# Patient Record
Sex: Female | Born: 1970 | Hispanic: No | Marital: Single | Smoking: Current every day smoker
Health system: Southern US, Community
[De-identification: ages and names within clinical notes are randomized; demographics above are authoritative.]

## PROBLEM LIST (undated history)

## (undated) DIAGNOSIS — Z87440 Personal history of urinary (tract) infections: Secondary | ICD-10-CM

## (undated) DIAGNOSIS — D649 Anemia, unspecified: Secondary | ICD-10-CM

## (undated) DIAGNOSIS — R011 Cardiac murmur, unspecified: Secondary | ICD-10-CM

## (undated) DIAGNOSIS — R55 Syncope and collapse: Secondary | ICD-10-CM

## (undated) DIAGNOSIS — E785 Hyperlipidemia, unspecified: Secondary | ICD-10-CM

## (undated) HISTORY — DX: Personal history of urinary (tract) infections: Z87.440

## (undated) HISTORY — DX: Hyperlipidemia, unspecified: E78.5

## (undated) HISTORY — DX: Syncope and collapse: R55

---

## 2010-10-08 ENCOUNTER — Emergency Department: Payer: Self-pay | Admitting: Emergency Medicine

## 2012-06-23 ENCOUNTER — Emergency Department: Payer: Self-pay | Admitting: Emergency Medicine

## 2012-06-23 LAB — COMPREHENSIVE METABOLIC PANEL WITH GFR
Albumin: 3.8 g/dL (ref 3.4–5.0)
Alkaline Phosphatase: 77 U/L (ref 50–136)
Anion Gap: 8 (ref 7–16)
BUN: 9 mg/dL (ref 7–18)
Bilirubin,Total: 0.3 mg/dL (ref 0.2–1.0)
Calcium, Total: 8.8 mg/dL (ref 8.5–10.1)
Chloride: 110 mmol/L — ABNORMAL HIGH (ref 98–107)
Co2: 23 mmol/L (ref 21–32)
Creatinine: 0.81 mg/dL (ref 0.60–1.30)
EGFR (African American): >60
EGFR (Non-African Amer.): >60
Glucose: 84 mg/dL (ref 65–99)
Osmolality: 279 (ref 275–301)
Potassium: 3.5 mmol/L (ref 3.5–5.1)
SGOT(AST): 11 U/L — ABNORMAL LOW (ref 15–37)
SGPT (ALT): 12 U/L (ref 12–78)
Sodium: 141 mmol/L (ref 136–145)
Total Protein: 7.4 g/dL (ref 6.4–8.2)

## 2012-06-23 LAB — URINALYSIS, COMPLETE
Blood: NEGATIVE
Glucose,UR: NEGATIVE mg/dL (ref 0–75)
RBC,UR: 5 /HPF (ref 0–5)
Specific Gravity: 1.03 (ref 1.003–1.030)
Squamous Epithelial: 4
WBC UR: 3 /HPF (ref 0–5)

## 2012-06-23 LAB — CBC
HGB: 10.4 g/dL — ABNORMAL LOW (ref 12.0–16.0)
MCH: 25.7 pg — ABNORMAL LOW (ref 26.0–34.0)
Platelet: 268 10*3/uL (ref 150–440)
RBC: 4.05 10*6/uL (ref 3.80–5.20)
WBC: 8.3 10*3/uL (ref 3.6–11.0)

## 2012-06-23 LAB — PREGNANCY, URINE: Pregnancy Test, Urine: NEGATIVE m[IU]/mL

## 2012-08-27 ENCOUNTER — Emergency Department: Payer: Self-pay | Admitting: Emergency Medicine

## 2012-08-27 LAB — BASIC METABOLIC PANEL
BUN: 6 mg/dL — ABNORMAL LOW (ref 7–18)
Creatinine: 0.74 mg/dL (ref 0.60–1.30)
EGFR (African American): >60
EGFR (Non-African Amer.): >60
Glucose: 91 mg/dL (ref 65–99)
Osmolality: 277 (ref 275–301)
Potassium: 3.3 mmol/L — ABNORMAL LOW (ref 3.5–5.1)
Sodium: 140 mmol/L (ref 136–145)

## 2012-08-27 LAB — CBC
HCT: 38.9 % (ref 35.0–47.0)
MCHC: 32.8 g/dL (ref 32.0–36.0)
RBC: 4.29 10*6/uL (ref 3.80–5.20)
WBC: 10.7 10*3/uL (ref 3.6–11.0)

## 2012-10-18 ENCOUNTER — Emergency Department: Payer: Self-pay | Admitting: Emergency Medicine

## 2012-10-18 LAB — CBC
HGB: 13.1 g/dL (ref 12.0–16.0)
MCH: 31.9 pg (ref 26.0–34.0)

## 2012-10-18 LAB — COMPREHENSIVE METABOLIC PANEL
Alkaline Phosphatase: 72 U/L (ref 50–136)
Anion Gap: 6 — ABNORMAL LOW (ref 7–16)
Bilirubin,Total: 0.4 mg/dL (ref 0.2–1.0)
Calcium, Total: 8.7 mg/dL (ref 8.5–10.1)
Co2: 23 mmol/L (ref 21–32)
Creatinine: 0.66 mg/dL (ref 0.60–1.30)
EGFR (Non-African Amer.): >60
Sodium: 139 mmol/L (ref 136–145)
Total Protein: 7.1 g/dL (ref 6.4–8.2)

## 2012-10-18 LAB — URINALYSIS, COMPLETE
Glucose,UR: NEGATIVE mg/dL (ref 0–75)
Ketone: NEGATIVE
Nitrite: NEGATIVE
Ph: 6 (ref 4.5–8.0)
RBC,UR: 3 /HPF (ref 0–5)
WBC UR: 23 /HPF (ref 0–5)

## 2012-10-18 LAB — GC/CHLAMYDIA PROBE AMP

## 2012-10-18 LAB — WET PREP, GENITAL

## 2012-10-20 LAB — URINE CULTURE

## 2013-01-28 ENCOUNTER — Emergency Department: Payer: Self-pay | Admitting: Emergency Medicine

## 2013-01-28 LAB — COMPREHENSIVE METABOLIC PANEL
Albumin: 2.9 g/dL — ABNORMAL LOW (ref 3.4–5.0)
Alkaline Phosphatase: 62 U/L (ref 50–136)
Bilirubin,Total: 0.5 mg/dL (ref 0.2–1.0)
Chloride: 111 mmol/L — ABNORMAL HIGH (ref 98–107)
Co2: 22 mmol/L (ref 21–32)
Creatinine: 0.63 mg/dL (ref 0.60–1.30)
EGFR (African American): >60
Glucose: 87 mg/dL (ref 65–99)
Osmolality: 274 (ref 275–301)
Potassium: 3.2 mmol/L — ABNORMAL LOW (ref 3.5–5.1)
SGOT(AST): 11 U/L — ABNORMAL LOW (ref 15–37)
Sodium: 139 mmol/L (ref 136–145)
Total Protein: 5.8 g/dL — ABNORMAL LOW (ref 6.4–8.2)

## 2013-01-28 LAB — CBC
HCT: 34.2 % — ABNORMAL LOW (ref 35.0–47.0)
MCH: 31.8 pg (ref 26.0–34.0)
MCV: 95 fL (ref 80–100)

## 2013-01-28 LAB — TROPONIN I: Troponin-I: <0.02 ng/mL

## 2013-05-26 ENCOUNTER — Emergency Department: Payer: Self-pay | Admitting: Emergency Medicine

## 2013-05-26 LAB — RAPID INFLUENZA A&B ANTIGENS (ARMC ONLY)

## 2013-07-30 ENCOUNTER — Emergency Department: Payer: Self-pay | Admitting: Emergency Medicine

## 2013-07-30 LAB — URINALYSIS, COMPLETE
Bilirubin,UR: NEGATIVE
GLUCOSE, UR: NEGATIVE mg/dL (ref 0–75)
Ketone: NEGATIVE
NITRITE: NEGATIVE
PH: 6 (ref 4.5–8.0)
PROTEIN: NEGATIVE
Specific Gravity: 1.008 (ref 1.003–1.030)
Squamous Epithelial: 2
WBC UR: 4 /HPF (ref 0–5)

## 2013-07-30 LAB — GC/CHLAMYDIA PROBE AMP

## 2013-07-30 LAB — WET PREP, GENITAL

## 2014-09-10 IMAGING — US US PELV - US TRANSVAGINAL
1 series · 14 of 25 positions shown · non-contrast
Comparison: none

REASON FOR EXAM: L adnexal pain, cervical motion tenderness
COMMENTS:

PROCEDURE:     US  - US PELVIS EXAM W/TRANSVAGINAL  - October 18, 2012  [DATE]
RESULT:
TECHNIQUE: Transabdominal and endovaginal imaging of the pelvis was
obtained.

[Series 1: us pelv - us transvaginal · 0.28mm/px · 14 of 79 slices shown]
[im 1/79]
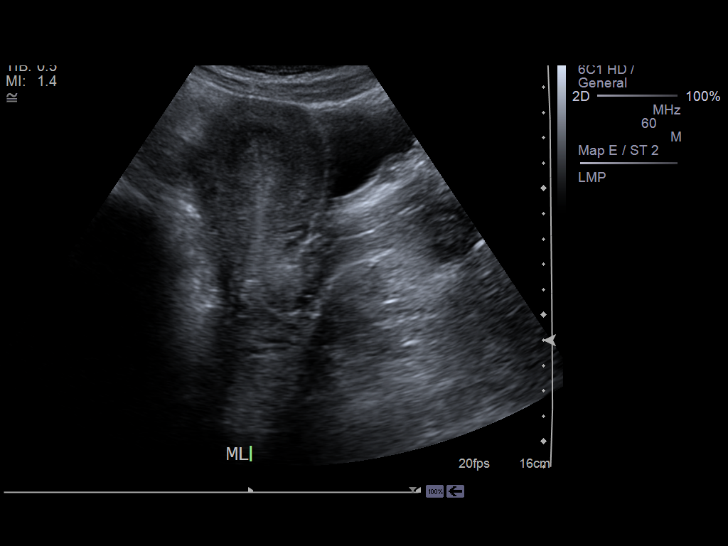
[im 7/79]
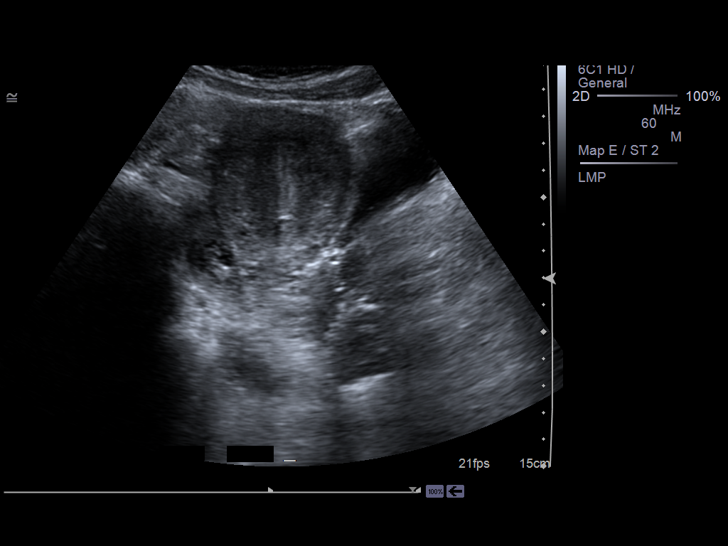
[im 14/79]
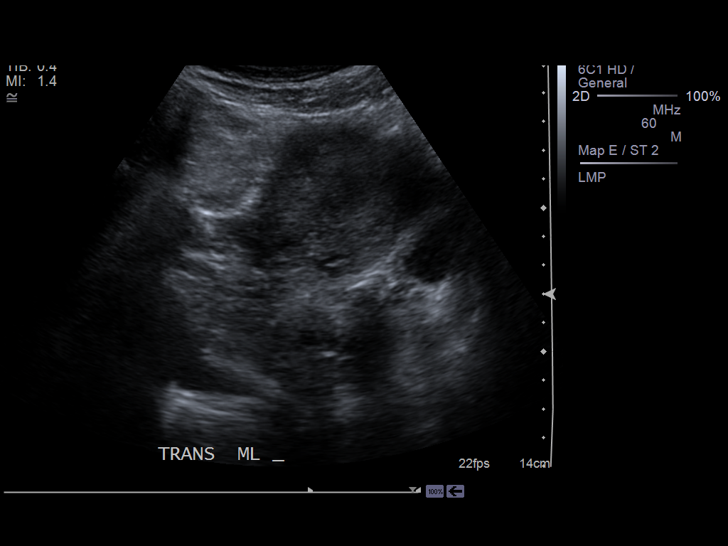
[im 20/79]
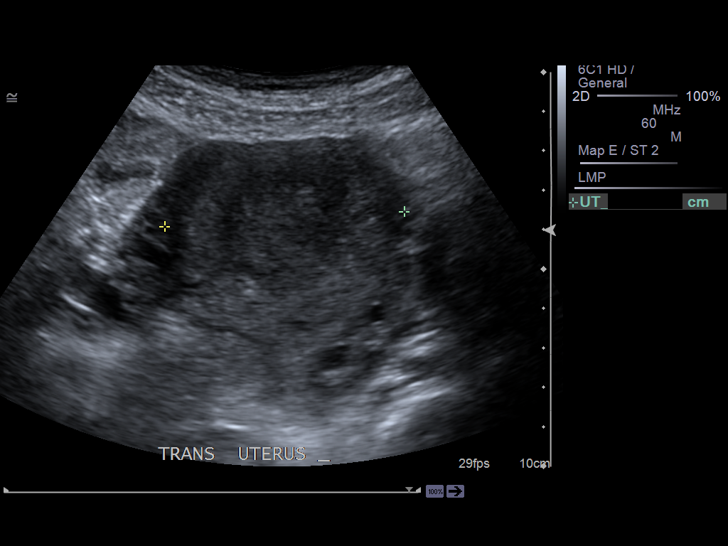
[im 27/79]
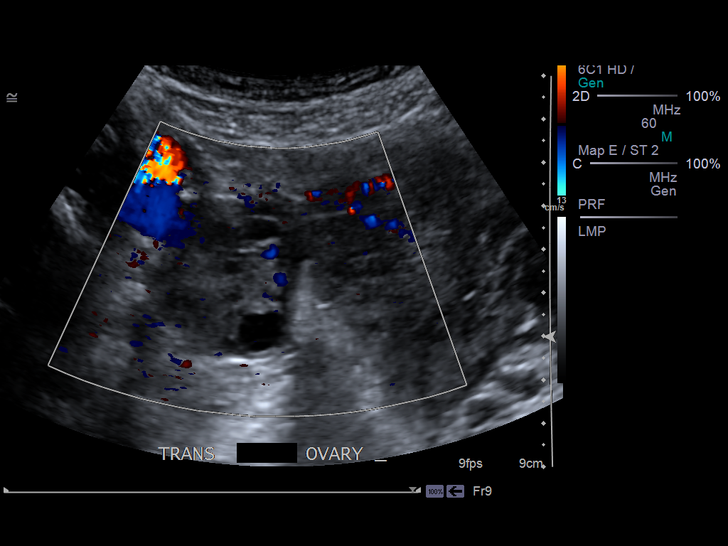
[im 30/79]
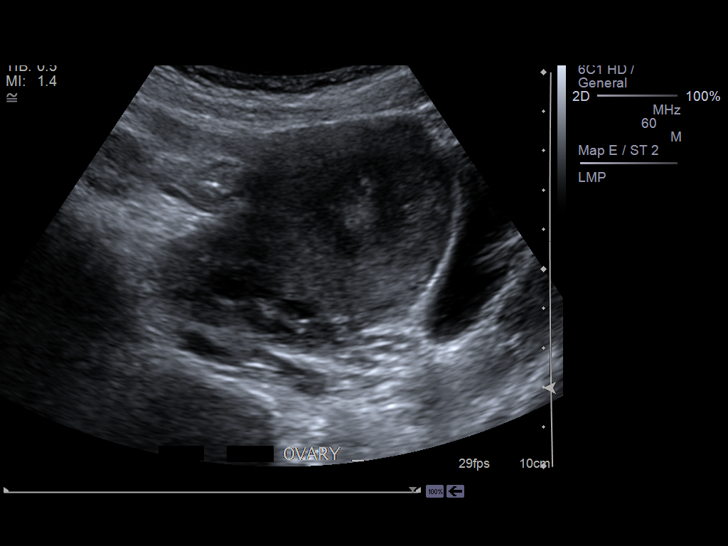
[im 36/79]
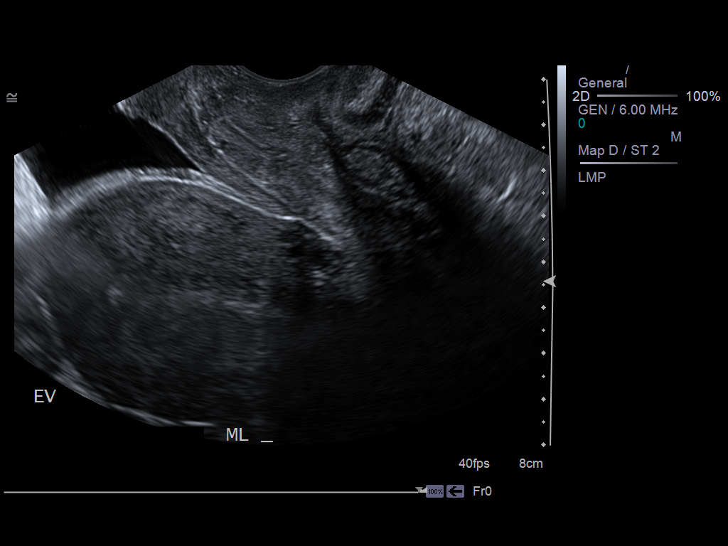
[im 43/79]
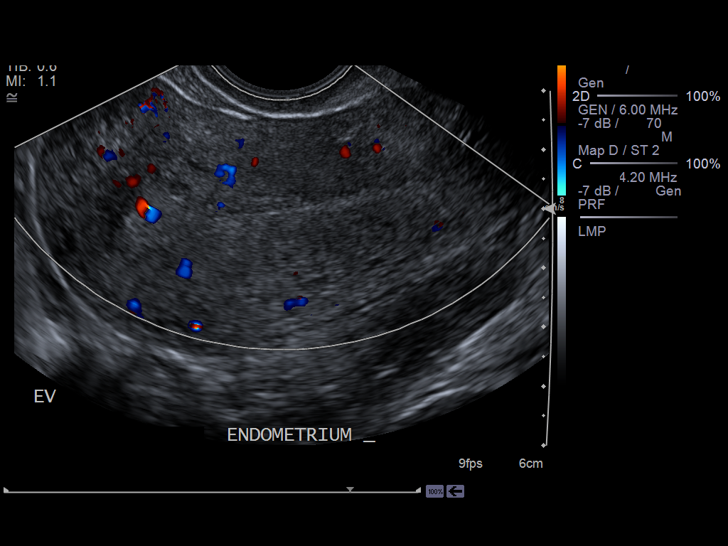
[im 49/79]
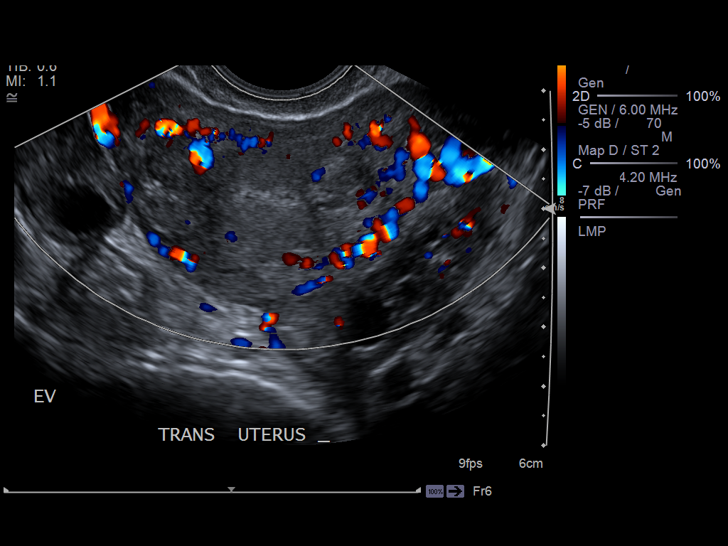
[im 53/79]
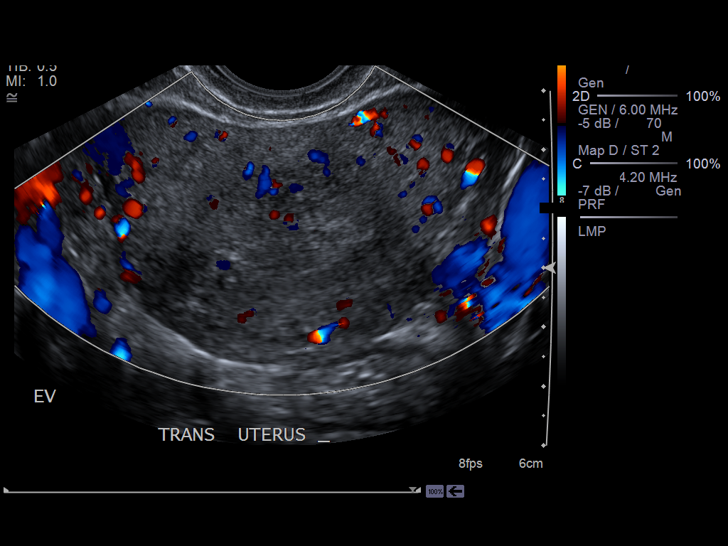
[im 59/79]
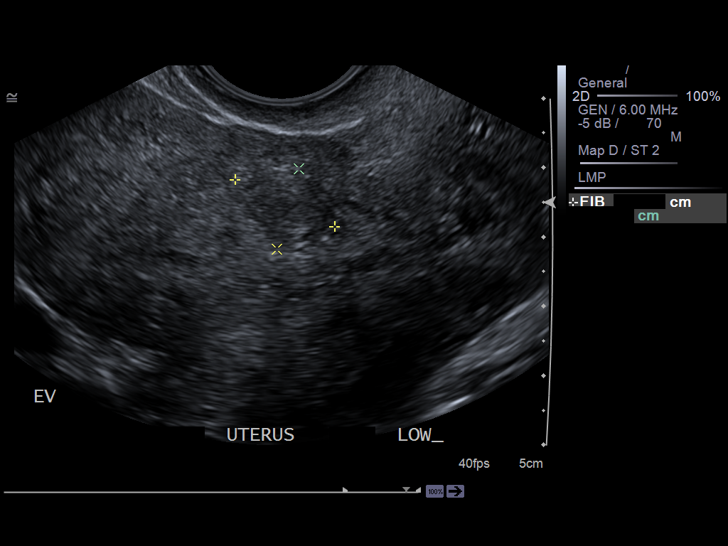
[im 66/79]
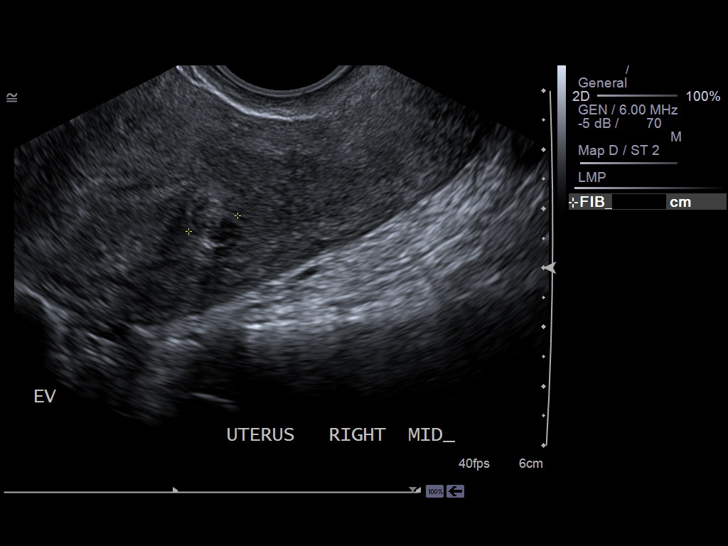
[im 72/79]
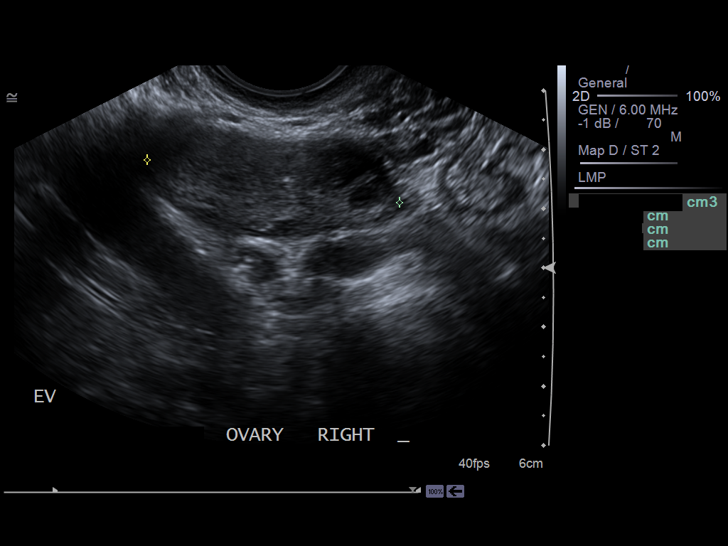
[im 79/79]
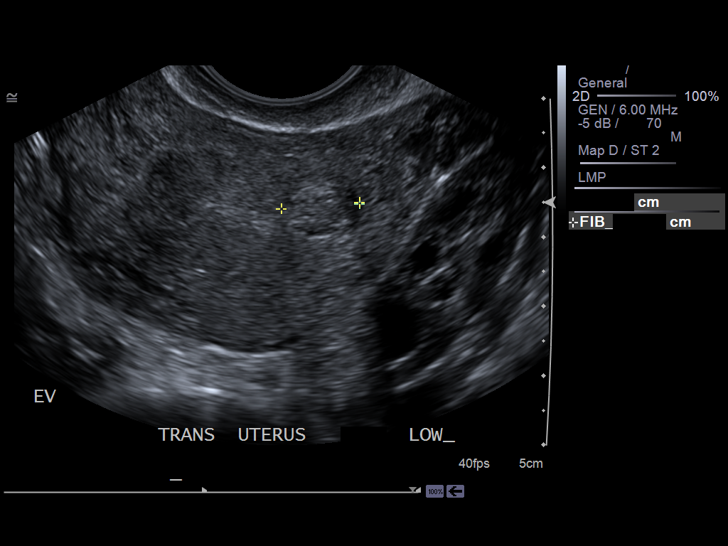

[14 of 25 positions shown; findings below may reference images not displayed]

FINDINGS: The uterus measures 9.44 x 6.09 x 5.12 cm. Multiple heterogeneous
masses are identified within the uterus consistent with multiple
leiomyomata. Endometrial thickness is 4.4 millimeters. There is no evidence
of free fluid or loculated fluid collections within the pelvis. The right
ovary measures 3.94 x 1.71 x 4.33 cm and the left 3.81 x 4.37 x 1.71 cm.
Color filling of vessels is identified within the right and left ovaries. A
dominant cyst is appreciated within the right ovary measuring 1.17 x 0.92 x
1.18 cm. A 1.07 x 1.11 x 1.12 cm cyst is identified within the cervix
consistent with a simple nabothian cyst.
IMPRESSION: 1. No acute abnormalities.
2. Dr. Cuddy of the Emergency Department was informed of these findings via
a preliminary faxed report.

## 2015-02-27 ENCOUNTER — Emergency Department
Admission: EM | Admit: 2015-02-27 | Discharge: 2015-02-27 | Disposition: A | Discharge status: Home / Self Care | Payer: 59 | Attending: Emergency Medicine | Admitting: Emergency Medicine

## 2015-02-27 ENCOUNTER — Encounter: Payer: Self-pay | Admitting: *Deleted

## 2015-02-27 DIAGNOSIS — R5381 Other malaise: Secondary | ICD-10-CM

## 2015-02-27 DIAGNOSIS — Z720 Tobacco use: Secondary | ICD-10-CM | POA: Diagnosis not present

## 2015-02-27 DIAGNOSIS — R5383 Other fatigue: Secondary | ICD-10-CM | POA: Diagnosis present

## 2015-02-27 DIAGNOSIS — Z79899 Other long term (current) drug therapy: Secondary | ICD-10-CM | POA: Insufficient documentation

## 2015-02-27 HISTORY — DX: Anemia, unspecified: D64.9

## 2015-02-27 HISTORY — DX: Cardiac murmur, unspecified: R01.1

## 2015-02-27 LAB — COMPREHENSIVE METABOLIC PANEL
ALK PHOS: 48 U/L (ref 38–126)
ALT: 12 U/L — ABNORMAL LOW (ref 14–54)
AST: 16 U/L (ref 15–41)
Albumin: 4.1 g/dL (ref 3.5–5.0)
Anion gap: 5 (ref 5–15)
BUN: 9 mg/dL (ref 6–20)
CALCIUM: 10.2 mg/dL (ref 8.9–10.3)
CO2: 26 mmol/L (ref 22–32)
Chloride: 108 mmol/L (ref 101–111)
Creatinine, Ser: 0.59 mg/dL (ref 0.44–1.00)
Glucose, Bld: 89 mg/dL (ref 65–99)
Potassium: 3.8 mmol/L (ref 3.5–5.1)
Sodium: 139 mmol/L (ref 135–145)
Total Protein: 7.4 g/dL (ref 6.5–8.1)

## 2015-02-27 LAB — URINALYSIS COMPLETE WITH MICROSCOPIC (ARMC ONLY)
BILIRUBIN URINE: NEGATIVE
Bacteria, UA: NONE SEEN
GLUCOSE, UA: NEGATIVE mg/dL
Hgb urine dipstick: NEGATIVE
KETONES UR: NEGATIVE mg/dL
Leukocytes, UA: NEGATIVE
Nitrite: NEGATIVE
Protein, ur: NEGATIVE mg/dL
SPECIFIC GRAVITY, URINE: 1.008 (ref 1.005–1.030)
pH: 7 (ref 5.0–8.0)

## 2015-02-27 LAB — CBC WITH DIFFERENTIAL/PLATELET
Basophils Absolute: 0.1 10*3/uL (ref 0–0.1)
Basophils Relative: 1 %
EOS ABS: 0.3 10*3/uL (ref 0–0.7)
Eosinophils Relative: 3 %
HCT: 36.3 % (ref 35.0–47.0)
HEMOGLOBIN: 12 g/dL (ref 12.0–16.0)
Lymphocytes Relative: 28 %
Lymphs Abs: 2.6 10*3/uL (ref 1.0–3.6)
MCH: 30.8 pg (ref 26.0–34.0)
MCHC: 33.1 g/dL (ref 32.0–36.0)
MCV: 93.2 fL (ref 80.0–100.0)
MONOS PCT: 9 %
Monocytes Absolute: 0.8 10*3/uL (ref 0.2–0.9)
Neutro Abs: 5.4 10*3/uL (ref 1.4–6.5)
Neutrophils Relative %: 59 %
PLATELETS: 234 10*3/uL (ref 150–440)
RBC: 3.9 MIL/uL (ref 3.80–5.20)
RDW: 15.9 % — ABNORMAL HIGH (ref 11.5–14.5)
WBC: 9.2 10*3/uL (ref 3.6–11.0)

## 2015-02-27 NOTE — ED Notes (Signed)
Pt reports hx of anemia but that she has not been taking her iron.

## 2015-02-27 NOTE — Discharge Instructions (Signed)
Fatigue  Fatigue is feeling tired all of the time, a lack of energy, or a lack of motivation. Occasional or mild fatigue is often a normal response to activity or life in general. However, long-lasting (chronic) or extreme fatigue may indicate an underlying medical condition.  HOME CARE INSTRUCTIONS   Watch your fatigue for any changes. The following actions may help to lessen any discomfort you are feeling:  · Talk to your health care provider about how much sleep you need each night. Try to get the required amount every night.  · Take medicines only as directed by your health care provider.  · Eat a healthy and nutritious diet. Ask your health care provider if you need help changing your diet.  · Drink enough fluid to keep your urine clear or pale yellow.  · Practice ways of relaxing, such as yoga, meditation, massage therapy, or acupuncture.  · Exercise regularly.    · Change situations that cause you stress. Try to keep your work and personal routine reasonable.  · Do not abuse illegal drugs.  · Limit alcohol intake to no more than 1 drink per day for nonpregnant women and 2 drinks per day for men. One drink equals 12 ounces of beer, 5 ounces of wine, or 1½ ounces of hard liquor.  · Take a multivitamin, if directed by your health care provider.  SEEK MEDICAL CARE IF:   · Your fatigue does not get better.  · You have a fever.    · You have unintentional weight loss or gain.  · You have headaches.    · You have difficulty:      Falling asleep.    Sleeping throughout the night.  · You feel angry, guilty, anxious, or sad.     · You are unable to have a bowel movement (constipation).    · You skin is dry.     · Your legs or another part of your body is swollen.    SEEK IMMEDIATE MEDICAL CARE IF:   · You feel confused.    · Your vision is blurry.  · You feel faint or pass out.    · You have a severe headache.    · You have severe abdominal, pelvic, or back pain.    · You have chest pain, shortness of breath, or an  irregular or fast heartbeat.    · You are unable to urinate or you urinate less than normal.    · You develop abnormal bleeding, such as bleeding from the rectum, vagina, nose, lungs, or nipples.  · You vomit blood.     · You have thoughts about harming yourself or committing suicide.    · You are worried that you might harm someone else.       This information is not intended to replace advice given to you by your health care provider. Make sure you discuss any questions you have with your health care provider.     Document Released: 02/27/2007 Document Revised: 05/23/2014 Document Reviewed: 09/03/2013  Elsevier Interactive Patient Education ©2016 [employer]vier [employer].

## 2015-02-27 NOTE — ED Provider Notes (Signed)
[facility] Emergency Department Provider Note ____________________________________________  Time seen: Approximately 7:43 PM  I have reviewed the triage vital signs and the nursing notes.   HISTORY  Chief Complaint Fatigue   HPI Kristin Espinoza is a 44 y.o. female who presents to the emergency department for evaluation of fatigue. She states that she works long hours and only sleeps a couple of hours per night. She states that typically she is able to maintain this, but after about 3 or 4 months she experiences excessive fatigue. She states that typically when she is fatigued she has anemia. She is not taking her iron due to cost and the insurance does not cover it. She denies heavy and lengthy menstruation. She denies noticing any blood in the stool.   Past Medical History  Diagnosis Date  . Anemia   . Heart murmur     There are no active problems to display for this patient.   History reviewed. No pertinent past surgical history.  Current Outpatient Rx  Name  Route  Sig  Dispense  Refill  . escitalopram (LEXAPRO) 10 MG tablet   Oral   Take 10 mg by mouth daily.           Allergies Review of patient's allergies indicates no known allergies.  History reviewed. No pertinent family history.  Social History Social History  Substance Use Topics  . Smoking status: Current Every Day Smoker -- 0.50 packs/day    Types: Cigarettes  . Smokeless tobacco: None  . Alcohol Use: No     Comment: social    Review of Systems Constitutional: No fever/chills Eyes: No visual changes. ENT: No sore throat. Cardiovascular: Denies chest pain. Respiratory: Denies shortness of breath. Gastrointestinal: No abdominal pain.  No nausea, no vomiting.  No diarrhea.  No constipation. Genitourinary: Negative for dysuria. Musculoskeletal: Negative for back pain. Skin: Negative for rash. Neurological: Negative for headaches, focal weakness or  numbness.  10-point ROS otherwise negative.  ____________________________________________   PHYSICAL EXAM:  VITAL SIGNS: ED Triage Vitals  Enc Vitals Group     BP 02/27/15 1849 129/95 mmHg     Pulse Rate 02/27/15 1849 59     Resp 02/27/15 1849 18     Temp 02/27/15 1849 98.2 F (36.8 C)     Temp Source 02/27/15 1849 Oral     SpO2 02/27/15 1849 100 %     Weight 02/27/15 1849 150 lb (68.04 kg)     Height 02/27/15 1849 5\' 4"  (1.626 m)     Head Cir --      Peak Flow --      Pain Score --      Pain Loc --      Pain Edu? --      Excl. in [place]? --     Constitutional: Alert and oriented. Well appearing and in no acute distress. Eyes: Conjunctivae are normal. PERRL. EOMI. Head: Atraumatic. Nose: No congestion/rhinnorhea. Mouth/Throat: Mucous membranes are moist.  Oropharynx non-erythematous. Neck: No stridor.   Cardiovascular: Normal rate, regular rhythm. Grossly normal heart sounds.  Good peripheral circulation. Respiratory: Normal respiratory effort.  No retractions. Lungs CTAB. Gastrointestinal: Soft and nontender. No distention. No abdominal bruits. No CVA tenderness. Musculoskeletal: No lower extremity tenderness nor edema.  No joint effusions. Neurologic:  Normal speech and language. No gross focal neurologic deficits are appreciated. No gait instability. Skin:  Skin is warm, dry and intact. No rash noted. Psychiatric: Mood and affect are normal. Speech and behavior  are normal.  ____________________________________________   LABS (all labs ordered are listed, but only abnormal results are displayed)  Labs Reviewed  CBC WITH DIFFERENTIAL/PLATELET - Abnormal; Notable for the following:    RDW 15.9 (*)    All other components within normal limits  URINALYSIS COMPLETEWITH MICROSCOPIC ([facility] ONLY) - Abnormal; Notable for the following:    Color, Urine STRAW (*)    APPearance HAZY (*)    Squamous Epithelial / LPF 0-5 (*)    All other components within normal limits   COMPREHENSIVE METABOLIC PANEL - Abnormal; Notable for the following:    ALT 12 (*)    Total Bilirubin <0.1 (*)    All other components within normal limits   ____________________________________________  EKG   ____________________________________________  RADIOLOGY   ____________________________________________   PROCEDURES  Procedure(s) performed: None  Critical Care performed: No  ____________________________________________   INITIAL IMPRESSION / ASSESSMENT AND PLAN / ED COURSE  Pertinent labs & imaging results that were available during my care of the patient were reviewed by me and considered in my medical decision making (see chart for details).  Patient was strongly advised to regulate her work schedule and eat healthy. She was advised that according to her labs tonight, she is not anemic.  She was advised to follow-up with her primary care provider. ____________________________________________   FINAL CLINICAL IMPRESSION(S) / ED DIAGNOSES  Final diagnoses:  Malaise and fatigue      Victorino Dike, FNP 02/27/15 [place], MD 03/01/15 [phone]

## 2015-02-27 NOTE — ED Notes (Signed)
Pt reports fatigue when working x a couple weeks.   States that this happens every 3-4 months due to working too much.

## 2015-11-13 ENCOUNTER — Other Ambulatory Visit: Payer: Self-pay | Admitting: Internal Medicine

## 2015-11-13 DIAGNOSIS — Z1231 Encounter for screening mammogram for malignant neoplasm of breast: Secondary | ICD-10-CM

## 2015-11-26 ENCOUNTER — Other Ambulatory Visit: Payer: Self-pay | Admitting: Internal Medicine

## 2015-11-26 ENCOUNTER — Ambulatory Visit
Admission: RE | Admit: 2015-11-26 | Discharge: 2015-11-26 | Disposition: A | Discharge status: Home / Self Care | Payer: 59 | Source: Ambulatory Visit | Attending: Internal Medicine | Admitting: Internal Medicine

## 2015-11-26 DIAGNOSIS — Z1231 Encounter for screening mammogram for malignant neoplasm of breast: Secondary | ICD-10-CM

## 2016-08-08 ENCOUNTER — Ambulatory Visit: Payer: Self-pay | Admitting: Obstetrics and Gynecology

## 2016-09-08 ENCOUNTER — Other Ambulatory Visit: Payer: Self-pay | Admitting: Internal Medicine

## 2016-09-08 DIAGNOSIS — Z1231 Encounter for screening mammogram for malignant neoplasm of breast: Secondary | ICD-10-CM

## 2016-11-28 ENCOUNTER — Ambulatory Visit
Admission: RE | Admit: 2016-11-28 | Discharge: 2016-11-28 | Disposition: A | Discharge status: Home / Self Care | Payer: 59 | Source: Ambulatory Visit | Attending: Internal Medicine | Admitting: Internal Medicine

## 2016-11-28 DIAGNOSIS — Z1231 Encounter for screening mammogram for malignant neoplasm of breast: Secondary | ICD-10-CM | POA: Diagnosis not present

## 2017-10-17 ENCOUNTER — Ambulatory Visit: Payer: Self-pay | Admitting: Obstetrics and Gynecology

## 2017-10-18 ENCOUNTER — Ambulatory Visit: Payer: 59 | Admitting: Obstetrics and Gynecology

## 2017-10-18 ENCOUNTER — Encounter: Payer: Self-pay | Admitting: Obstetrics and Gynecology

## 2017-10-18 VITALS — BP 110/70 | HR 86 | Ht 64.0 in | Wt 137.5 lb

## 2017-10-18 DIAGNOSIS — N393 Stress incontinence (female) (male): Secondary | ICD-10-CM

## 2017-10-18 DIAGNOSIS — N3942 Incontinence without sensory awareness: Secondary | ICD-10-CM

## 2017-10-18 DIAGNOSIS — B9689 Other specified bacterial agents as the cause of diseases classified elsewhere: Secondary | ICD-10-CM

## 2017-10-18 DIAGNOSIS — N760 Acute vaginitis: Secondary | ICD-10-CM | POA: Diagnosis not present

## 2017-10-18 LAB — POCT WET PREP WITH KOH
Clue Cells Wet Prep HPF POC: POSITIVE
KOH Prep POC: POSITIVE — AB
Trichomonas, UA: NEGATIVE
Yeast Wet Prep HPF POC: NEGATIVE

## 2017-10-18 MED ORDER — METRONIDAZOLE 500 MG PO TABS: 500.0000 mg | ORAL_TABLET | Freq: Two times a day (BID) | ORAL | 0 refills | Status: AC

## 2017-10-18 NOTE — Progress Notes (Signed)
Perrin Maltese, MD   Chief Complaint  Patient presents with  . Vaginitis    D/c, sometimes has odor x2 weeks     HPI:      [name] is a 47 y.o. No obstetric history on file. who LMP was Patient's last menstrual period was 10/02/2017 (exact date)., presents today for NP eval of increased d/c with odor, no irritation for the past 2 wks. No recent abx use. No LBP, belly pain, fevers. She is sex active, no new partners. Has several yr hx of excessive vag d/c during sex but has neg testing.  She also notes SUI sx and random urinary incontinence. Wears pantyliners.  No urge incont sx. Drinks caffeine all day.    Past Medical History:  Diagnosis Date  . Anemia   . Heart murmur     History reviewed. No pertinent surgical history.  Family History  Problem Relation Age of Onset  . Breast cancer Neg Hx     Social History   Socioeconomic History  . Marital status: Single    Spouse name: Not on file  . Number of children: Not on file  . Years of education: Not on file  . Highest education level: Not on file  Occupational History  . Not on file  Social Needs  . Financial resource strain: Not on file  . Food insecurity:    Worry: Not on file    Inability: Not on file  . Transportation needs:    Medical: Not on file    Non-medical: Not on file  Tobacco Use  . Smoking status: Current Every Day Smoker    Packs/day: 0.50    Types: Cigarettes  . Smokeless tobacco: Never Used  Substance and Sexual Activity  . Alcohol use: No    Comment: social  . Drug use: Not on file  . Sexual activity: Yes    Birth control/protection: None  Lifestyle  . Physical activity:    Days per week: Not on file    Minutes per session: Not on file  . Stress: Not on file  Relationships  . Social connections:    Talks on phone: Not on file    Gets together: Not on file    Attends religious service: Not on file    Active member of club or organization: Not on file    Attends  meetings of clubs or organizations: Not on file    Relationship status: Not on file  . Intimate partner violence:    Fear of current or ex partner: Not on file    Emotionally abused: Not on file    Physically abused: Not on file    Forced sexual activity: Not on file  Other Topics Concern  . Not on file  Social History Narrative  . Not on file    Outpatient Medications Prior to Visit  Medication Sig Dispense Refill  . busPIRone (BUSPAR) 10 MG tablet TK 1 T PO BID  5  . TRINTELLIX 20 MG TABS tablet Take 20 mg by mouth daily.  6  . escitalopram (LEXAPRO) 10 MG tablet Take 10 mg by mouth daily.     No facility-administered medications prior to visit.     ROS:  Review of Systems  Constitutional: Negative for fatigue, fever and unexpected weight change.  Respiratory: Negative for cough, shortness of breath and wheezing.   Cardiovascular: Negative for chest pain, palpitations and leg swelling.  Gastrointestinal: Negative for blood in stool, constipation,  diarrhea, nausea and vomiting.  Endocrine: Negative for cold intolerance, heat intolerance and polyuria.  Genitourinary: Positive for difficulty urinating and vaginal discharge. Negative for dyspareunia, dysuria, flank pain, frequency, genital sores, hematuria, menstrual problem, pelvic pain, urgency, vaginal bleeding and vaginal pain.  Musculoskeletal: Negative for back pain, joint swelling and myalgias.  Skin: Negative for rash.  Neurological: Negative for dizziness, syncope, light-headedness, numbness and headaches.  Hematological: Negative for adenopathy.  Psychiatric/Behavioral: Negative for agitation, confusion, sleep disturbance and suicidal ideas. The patient is not nervous/anxious.    BREAST: No symptoms   OBJECTIVE:   Vitals:  BP 110/70   Pulse 86   Ht 5\' 4"  (1.626 m)   Wt 137 lb 8 oz (62.4 kg)   LMP 10/02/2017 (Exact Date)   BMI 23.60 kg/m   Physical Exam  Constitutional: She is oriented to person, place, and  time. Vital signs are normal. She appears well-developed.  Pulmonary/Chest: Effort normal.  Genitourinary: Uterus normal. There is no rash, tenderness or lesion on the right labia. There is no rash, tenderness or lesion on the left labia. Uterus is not enlarged and not tender. Cervix exhibits no motion tenderness. Right adnexum displays no mass and no tenderness. Left adnexum displays no mass and no tenderness. No erythema or tenderness in the vagina. Vaginal discharge found.  Musculoskeletal: Normal range of motion.  Neurological: She is alert and oriented to person, place, and time.  Psychiatric: She has a normal mood and affect. Her behavior is normal. Thought content normal.  Vitals reviewed.   Results: Results for orders placed or performed in visit on 10/18/17 (from the past 24 hour(s))  POCT Wet Prep with KOH     Status: Abnormal   Collection Time: 10/18/17  3:31 PM  Result Value Ref Range   Trichomonas, UA Negative    Clue Cells Wet Prep HPF POC pos    Epithelial Wet Prep HPF POC  Few, Moderate, Many, Too numerous to count   Yeast Wet Prep HPF POC neg    Bacteria Wet Prep HPF POC  Few   RBC Wet Prep HPF POC     WBC Wet Prep HPF POC     KOH Prep POC Positive (A) Negative     Assessment/Plan: Bacterial vaginosis - Pos sx/wet prep. Rx flagyl. No EtOH. F/u prn.  - Plan: POCT Wet Prep with KOH, metroNIDAZOLE (FLAGYL) 500 MG tablet  Urinary incontinence without sensory awareness - D/C caffeine to see if makes a difference, void frequently. F/u prn sx.  SUI (stress urinary incontinence, female)    Meds ordered this encounter  Medications  . metroNIDAZOLE (FLAGYL) 500 MG tablet    Sig: Take 1 tablet (500 mg total) by mouth 2 (two) times daily for 7 days.    Dispense:  14 tablet    Refill:  0    Order Specific Question:   Supervising Provider    Answer:   Gae Dry [[id number]]      Return if symptoms worsen or fail to improve.  Alicia B. Copland, PA-C 10/18/2017 3:33  PM

## 2017-10-18 NOTE — Patient Instructions (Signed)
I value your feedback and entrusting us with your care. If you get a [facility] patient survey, I would appreciate you taking the time to let us know about your experience today. Thank you! 

## 2017-10-30 ENCOUNTER — Telehealth: Payer: Self-pay

## 2017-10-30 NOTE — Telephone Encounter (Signed)
At her visit a few weeks ago with [facility] theydiscussed medication for urine leakage. She has laid off caffeine at the advise of [facility]. Still having leakage. She would like to try the medication.  [facility] in [place]

## 2017-10-31 NOTE — Telephone Encounter (Signed)
Pt f/u on yesterday's message regarding meds. Inquiring if urine leakage meds were sent to pharmacy. NT#[phone]

## 2017-10-31 NOTE — Telephone Encounter (Signed)
Meds not sent yet. Waiting to talk to Dr. Kenton Kingfisher, our MD who specializes in urogynecology to see if meds vs further eval needed, since pt has leakage without being aware of need to void. I will call her to further discuss after talking to him. He is not in office today.

## 2017-11-01 NOTE — Telephone Encounter (Signed)
Left message to make pt awake

## 2017-11-02 MED ORDER — FLUCONAZOLE 150 MG PO TABS: 150.0000 mg | ORAL_TABLET | Freq: Once | ORAL | 0 refills | Status: AC

## 2017-11-02 NOTE — Telephone Encounter (Signed)
Spoke with pt. BV sx resolved except still has d/c, no odor. No has mild itch. Treated with flagyl. Rx diflucan eRxd in case yeast vag.   Pt with complaints of SUI at 6/19 appt as well as random leakage without any sensation of having to void. No OAB/urge incont sx. Pt stopped caffeine as I discussed without sx relief. Wants Rx med. Discussed with RPH. Pt needs further bladder eval. Pt to make appt with him.

## 2017-11-02 NOTE — Addendum Note (Signed)
Addended by: Ardeth Perfect B on: 6/42/9037 10:37 AM   Modules accepted: Orders

## 2017-11-08 ENCOUNTER — Other Ambulatory Visit: Payer: Self-pay | Admitting: Obstetrics and Gynecology

## 2017-11-08 ENCOUNTER — Telehealth: Payer: Self-pay

## 2017-11-08 MED ORDER — CLINDAMYCIN PHOSPHATE 2 % VA CREA: 1.0000 | TOPICAL_CREAM | Freq: Every day | VAGINAL | 0 refills | Status: DC

## 2017-11-08 NOTE — Telephone Encounter (Signed)
Pt states "still having d/c that is a creamy color with a slight odor, just like it was before just not as bad". Please advise.

## 2017-11-08 NOTE — Telephone Encounter (Signed)
BV cream (cleocin) eRxd since pt didn't respond to flagyl. RTO if sx still persist after tx for culture. Should go away after tx. RN to notify pt.

## 2017-11-08 NOTE — Telephone Encounter (Signed)
What sx is she still having? Rn to clarify.

## 2017-11-08 NOTE — Progress Notes (Signed)
Rx cleocin since BV sx not resolved with flagyl. RTO for culture if sx still persist after tx.

## 2017-11-08 NOTE — Telephone Encounter (Signed)
Pt aware.

## 2017-11-08 NOTE — Telephone Encounter (Signed)
Pt called triage stating she had previously had BV and taken the medication to make it go away and he feels better but she still feels that it is still not completely gone. Pt is asking to get a refill on the medication.

## 2017-11-13 ENCOUNTER — Ambulatory Visit: Payer: 59 | Admitting: Obstetrics & Gynecology

## 2017-11-23 ENCOUNTER — Ambulatory Visit: Payer: 59 | Admitting: Obstetrics & Gynecology

## 2017-11-28 ENCOUNTER — Ambulatory Visit: Payer: 59 | Admitting: Obstetrics & Gynecology

## 2017-12-07 ENCOUNTER — Other Ambulatory Visit (HOSPITAL_COMMUNITY)
Admission: RE | Admit: 2017-12-07 | Discharge: 2017-12-07 | Disposition: A | Discharge status: Home / Self Care | Payer: 59 | Source: Ambulatory Visit | Attending: Obstetrics & Gynecology | Admitting: Obstetrics & Gynecology

## 2017-12-07 ENCOUNTER — Encounter: Payer: Self-pay | Admitting: Obstetrics & Gynecology

## 2017-12-07 ENCOUNTER — Ambulatory Visit: Payer: 59 | Admitting: Obstetrics & Gynecology

## 2017-12-07 VITALS — BP 110/70 | Ht 64.0 in | Wt 144.0 lb

## 2017-12-07 DIAGNOSIS — R32 Unspecified urinary incontinence: Secondary | ICD-10-CM

## 2017-12-07 DIAGNOSIS — B373 Candidiasis of vulva and vagina: Secondary | ICD-10-CM

## 2017-12-07 DIAGNOSIS — N898 Other specified noninflammatory disorders of vagina: Secondary | ICD-10-CM | POA: Insufficient documentation

## 2017-12-07 DIAGNOSIS — B3731 Acute candidiasis of vulva and vagina: Secondary | ICD-10-CM

## 2017-12-07 MED ORDER — TERCONAZOLE 0.4 % VA CREA: 1.0000 | TOPICAL_CREAM | Freq: Every day | VAGINAL | 0 refills | Status: DC

## 2017-12-07 NOTE — Patient Instructions (Signed)
Terconazole vaginal cream What is this medicine? TERCONAZOLE (ter KON a zole) is an antifungal medicine. It is used to treat yeast infections of the vagina. This medicine may be used for other purposes; ask your health care provider or pharmacist if you have questions. COMMON BRAND NAME(S): Terazol 3, Terazol 7, Zazole What should I tell my health care provider before I take this medicine? They need to know if you have any of these conditions: -an unusual or allergic reaction to terconazole, other antifungals, other medicines, foods, dyes or preservatives -pregnant or trying to get pregnant -breast-feeding How should I use this medicine? This medicine is only for use in the vagina. Do not take by mouth. Wash hands before and after use. Read package directions carefully before using. Use this medicine at bedtime, unless otherwise directed by your doctor or health care professional. Screw the applicator onto the end of the tube and squeeze the tube to fill the applicator. Remove the applicator from the tube. Lie on your back. Gently insert the applicator tip high in the vagina and push the plunger to release the cream into the vagina. Gently remove the applicator. Wash the applicator well with warm water and soap. Use at regular intervals. Do not get this medicine in your eyes. If you do, rinse out with plenty of cool tap water. Finish the full course prescribed by your doctor or health care professional even if you think your condition is better. Do not stop using this medicine if your menstrual period starts during the time of treatment. Talk to your pediatrician regarding the use of this medicine in children. Special care may be needed. Overdosage: If you think you have taken too much of this medicine contact a poison control center or emergency room at once. NOTE: This medicine is only for you. Do not share this medicine with others. What if I miss a dose? If you miss a dose, use it as soon as you  can. If it is almost time for your next dose, use only that dose. Do not use double or extra doses. What may interact with this medicine? Interactions are not expected. Do not use any other vaginal products without telling your doctor or health care professional. This list may not describe all possible interactions. Give your health care provider a list of all the medicines, herbs, non-prescription drugs, or dietary supplements you use. Also tell them if you smoke, drink alcohol, or use illegal drugs. Some items may interact with your medicine. What should I watch for while using this medicine? Tell your doctor or health care professional if your symptoms do not start to get better within a few days. It is better not to have sex until you have finished your treatment. If you have sex, your partner should use a condom during sex to help prevent transfer of the infection. Your sexual partner may also need treatment. Vaginal medicines usually will come out of the vagina during treatment. To keep the medicine from getting on your clothing, wear a mini-pad or sanitary napkin. The use of tampons is not recommended since they may soak up the medicine. To help clear up the infection, wear freshly washed cotton, not synthetic, underwear. What side effects may I notice from receiving this medicine? Side effects that you should report to your doctor or health care professional as soon as possible: -painful or difficult urination -vaginal pain Side effects that usually do not require medical attention (report to your doctor or health care professional if  they continue or are bothersome): -headache -menstrual pain -stomach upset -vaginal irritation, itching or burning This list may not describe all possible side effects. Call your doctor for medical advice about side effects. You may report side effects to FDA at 1-800-FDA-1088. Where should I keep my medicine? Keep out of the reach of children. Store at room  temperature between 15 and 30 degrees C (59 and 86 degrees F). Throw away any unused medicine after the expiration date. NOTE: This sheet is a summary. It may not cover all possible information. If you have questions about this medicine, talk to your doctor, pharmacist, or health care provider.  2018 Elsevier/Gold Standard (2008-01-16 13:51:27)

## 2017-12-07 NOTE — Progress Notes (Signed)
HPI:      [name] is a 47 y.o. G1P1001 who LMP was Patient's last menstrual period was 11/18/2017., presents today for a problem visit.  She complains of an abnormal vaginal discharge for that has persisted for the last 6 weeks.  She has been dx/tx for BV twice.  Flagyl and Cleocin.  Helps during tx but not long afterwards she feels the uncomfortable vaginal sensation and discharge; also it is uncomfortable to have sex, so she has been abstaining.  Also reports LOU w cough and sneeze, no urge or freq or nocturia. Wears a liner for this. Reg periods.  No sx's of menopause.  PMHx: She  has a past medical history of Anemia and Heart murmur. Also,  has no past surgical history on file., family history is not on file.,  reports that she has been smoking cigarettes.  She has been smoking about 0.50 packs per day. She has never used smokeless tobacco. She reports that she does not drink alcohol or use drugs.  She has a current medication list which includes the following prescription(s): buspirone, clindamycin, escitalopram, terconazole, and trintellix. Also, has No Known Allergies.  Review of Systems  Constitutional: Negative for chills, fever and malaise/fatigue.  HENT: Negative for congestion, sinus pain and sore throat.   Eyes: Negative for blurred vision and pain.  Respiratory: Negative for cough and wheezing.   Cardiovascular: Negative for chest pain and leg swelling.  Gastrointestinal: Negative for abdominal pain, constipation, diarrhea, heartburn, nausea and vomiting.  Genitourinary: Negative for dysuria, frequency, hematuria and urgency.  Musculoskeletal: Negative for back pain, joint pain, myalgias and neck pain.  Skin: Negative for itching and rash.  Neurological: Negative for dizziness, tremors and weakness.  Endo/Heme/Allergies: Does not bruise/bleed easily.  Psychiatric/Behavioral: Negative for depression. The patient is not nervous/anxious and does not have insomnia.      Objective: BP 110/70   Ht 5\' 4"  (1.626 m)   Wt 144 lb (65.3 kg)   LMP 11/18/2017   BMI 24.72 kg/m  Physical Exam  Constitutional: She is oriented to person, place, and time. She appears well-developed and well-nourished. No distress.  Genitourinary: Vagina normal and uterus normal. Pelvic exam was performed with patient supine. There is no rash, tenderness or lesion on the right labia. There is no rash, tenderness or lesion on the left labia. No erythema or bleeding in the vagina. Right adnexum does not display mass and does not display tenderness. Left adnexum does not display mass and does not display tenderness. Cervix does not exhibit motion tenderness, discharge, polyp or nabothian cyst.   Uterus is mobile and midaxial. Uterus is not enlarged or exhibiting a mass.  Genitourinary Comments: Heavy white cheese-like discharge  HENT:  Head: Normocephalic and atraumatic.  Nose: Nose normal.  Mouth/Throat: Oropharynx is clear and moist.  Abdominal: Soft. She exhibits no distension. There is no tenderness.  Musculoskeletal: Normal range of motion.  Neurological: She is alert and oriented to person, place, and time. No cranial nerve deficit.  Skin: Skin is warm and dry.  Psychiatric: She has a normal mood and affect.   WET PREP:   positive hyphae and positive WBC's Findings are consistent with monilia vaginitis.  ASSESSMENT/PLAN:    Problem List Items Addressed This Visit      Other   Vaginal discharge - Primary   Relevant Orders   Cervicovaginal ancillary only   Urinary incontinence in female    Other Visit Diagnoses    Candida vaginitis  Treat w 7 day course of Terazole Consider adding in BV tx if culture positive  Await stress incontinence until this resolves, and for worsening.  Tx options discussed.  Kegels for now for maintenance.    Barnett Applebaum, MD, [facility], [facility] 12/07/2017  4:37 PM

## 2017-12-11 LAB — CERVICOVAGINAL ANCILLARY ONLY
Bacterial vaginitis: NEGATIVE
CHLAMYDIA, DNA PROBE: NEGATIVE
Candida vaginitis: NEGATIVE
NEISSERIA GONORRHEA: NEGATIVE
TRICH (WINDOWPATH): NEGATIVE

## 2017-12-29 ENCOUNTER — Telehealth: Payer: Self-pay

## 2017-12-29 MED ORDER — FLUCONAZOLE 150 MG PO TABS: 150.0000 mg | ORAL_TABLET | ORAL | 5 refills | Status: AC

## 2017-12-29 NOTE — Telephone Encounter (Signed)
Pt aware.

## 2017-12-29 NOTE — Telephone Encounter (Signed)
ERx weekly does of Diflucan pill for a month to see if can obtain complete eradication of this potential infection

## 2017-12-29 NOTE — Telephone Encounter (Signed)
Pt is still having issues with yeast. She has been in several times for this. She is frustrated that she is still dealing with it. She doesn't want to have a $50 office visit. She is requesting different/stronger med. [phone].

## 2017-12-29 NOTE — Telephone Encounter (Signed)
Pt cont w symptoms of concern.   Last culture was neg for BV and yeast   Last wet prep prior was pos for yeast   She has been treated for both over the last 2 mos Rec weekly suppressive therapy w Diflucan as its only pathogen we have seen on diagnostic testing Adjust plan as needed.  Barnett Applebaum, MD, [facility], [facility] 12/29/2017  10:33 AM

## 2020-03-18 ENCOUNTER — Other Ambulatory Visit: Payer: Self-pay

## 2020-03-18 ENCOUNTER — Ambulatory Visit: Payer: 59 | Admitting: Dermatology

## 2020-03-18 DIAGNOSIS — L905 Scar conditions and fibrosis of skin: Secondary | ICD-10-CM

## 2020-03-18 DIAGNOSIS — D170 Benign lipomatous neoplasm of skin and subcutaneous tissue of head, face and neck: Secondary | ICD-10-CM | POA: Diagnosis not present

## 2020-03-18 DIAGNOSIS — L700 Acne vulgaris: Secondary | ICD-10-CM | POA: Diagnosis not present

## 2020-03-18 DIAGNOSIS — L720 Epidermal cyst: Secondary | ICD-10-CM | POA: Diagnosis not present

## 2020-03-18 MED ORDER — ADAPALENE 0.3 % EX GEL: CUTANEOUS | 6 refills | Status: DC

## 2020-03-18 NOTE — Patient Instructions (Addendum)
Kristin Espinoza [address] [place] [address]   You have an upcoming surgery appointment scheduled at [facility].   PRE-OPERATIVE INSTRUCTIONS  We recommend you read the following instructions. If you have any questions or concerns, please call the office at [phone].  1. Shower and wash the entire body with soap and water the day of your surgery paying special attention to cleansing at and around the planned surgery site. 2. Avoid aspirin or aspirin containing products at least ten (10) days prior to your surgical procedure and for at least one week (7 days) after your surgical procedure. If you take aspirin on a regular basis for heart disease or history of stroke or for any other reason, we may recommend you continue taking aspirin but please notify us if you take this on a regular basis. Aspirin can cause more bleeding to occur during surgery as well as prolonged bleeding and bruising after surgery. 3. Avoid other nonsteroidal pain medications at least one week prior to surgery and at least one week after your surgery. These include medications such as Ibuprofen (Motrin, Advil and Nuprin), Naprosyn, Voltaren, Relafen, etc. If these medications are used for therapeutic reasons, please inform us as they can cause increased bleeding or prolonged bleeding during and bruising after surgical procedures.  4. Please advise [place] if you are taking any "blood thinner" medications such as Coumadin or Dipyridamole or Plavix or similar medications. These cause increased bleeding and prolonged bleeding during procedures and bruising after surgical procedures. We may have to consider discontinuing these medications briefly prior to and shortly after your surgery if safe to do so. 5. Please inform us of all medications you are currently taking. All medications that are taken regularly should be taken the day of surgery as you always do. Nevertheless, we need to be informed of what medications you  are taking prior to surgery to know whether they will affect the procedure or cause any complications. 6. Please inform us of any medication allergies. Also inform us of whether you have allergies to Latex or rubber products or whether you have had any adverse reaction to Lidocaine or Epinephrine. 7. Please inform us of any prosthetic or artificial body parts such as artificial heart valve, joint replacements, etc., or similar condition that might require preoperative antibiotics. 8. We recommend avoidance of alcohol at least two weeks prior to surgery and continued avoidance for at least two weeks after surgery. 9. We recommend avoidance of tobacco smoking at least two weeks prior to surgery and continued abstinence for at least two weeks after surgery. 10. Do not plan strenuous exercise, strenuous work or strenuous lifting for approximately four weeks after your surgery. 11. We request if you are unable to make your scheduled surgical appointment, please call us at least a week in advance or as soon as you are aware of a problem so that we can cancel or reschedule the appointment. 12. You MAY TAKE TYLENOL (acetaminophen) for pain as it is not a blood thinner. 13. PLEASE PLAN TO BE IN TOWN FOR TWO WEEKS FOLLOWING SURGERY. THIS IS IMPORTANT SO YOU CAN BE CHECKED FOR DRESSING CHANGES, SUTURE REMOVAL AND TO MONITOR FOR POSSIBLE COMPLICATIONS.    Topical retinoid medications like tretinoin/Retin-A, adapalene/Differin, tazarotene/Fabior, and Epiduo/Epiduo Forte can cause dryness and irritation when first started. Only apply a pea-sized amount to the entire affected area. Avoid applying it around the eyes, edges of mouth and creases at the nose. If you experience irritation, use  a good moisturizer first and/or apply the medicine less often. If you are doing well with the medicine, you can increase how often you use it until you are applying every night. Be careful with sun protection while using this  medication as it can make you sensitive to the sun. This medicine should not be used by pregnant women.

## 2020-03-18 NOTE — Progress Notes (Signed)
   Follow-Up Visit   Subjective  Kristin Espinoza is a 49 y.o. female who presents for the following: Lesion (L temple area - bump has become firmer to the touch, patient is concerned about it leaving a scar). Hx of drainage and intact hair.   The following portions of the chart were reviewed this encounter and updated as appropriate:  Tobacco  Allergies  Meds  Problems  Med Hx  Surg Hx  Fam Hx     Review of Systems:  No other skin or systemic complaints except as noted in HPI or Assessment and Plan.  Objective  Well appearing patient in no apparent distress; mood and affect are within normal limits.  A focused examination was performed including the face . Relevant physical exam findings are noted in the Assessment and Plan.  Objective  L temple/ant sideburn: 0.7 cm subcutaneous nodule.   Objective  R cheek, R lat canthus temple: 1.0 cm Dyspigmented smooth macule or patch.  Linear scar of the right cheek.  Objective  Mid to inf forehead R of midline: 1.0 cm fatty SQ nodule   Objective  Face: [place]  Assessment & Plan  Epidermal inclusion cyst L temple/ant sideburn Discussed surgical excision, but she will be replacing cyst with a scar. Preop form given.  Schedule for surgery if desired.  Scar (2) R cheek; R lat canthus temple R cheek from glass door cut.  R canthus from large boil years ago. Benign appearing - observe  Lipoma of face Mid to inf forehead R of midline 1 cm Do not recommend excision unless it becomes bothersome or painful or grows.   Acne vulgaris - chronic persistent  Face  Start Differin 0.3% gel QHS. Topical retinoid medications like tretinoin/Retin-A, adapalene/Differin, tazarotene/Fabior, and Epiduo/Epiduo Forte can cause dryness and irritation when first started. Only apply a pea-sized amount to the entire affected area. Avoid applying it around the eyes, edges of mouth and creases at the nose. If you experience irritation, use a  good moisturizer first and/or apply the medicine less often. If you are doing well with the medicine, you can increase how often you use it until you are applying every night. Be careful with sun protection while using this medication as it can make you sensitive to the sun. This medicine should not be used by pregnant women.    Adapalene (DIFFERIN) 0.3 % gel - Face  Return for surgery of cyst of the L temple area.  Luther Redo, CMA, am acting as scribe for Sarina Ser, MD .  Documentation: I have reviewed the above documentation for accuracy and completeness, and I agree with the above.  Sarina Ser, MD

## 2020-03-19 ENCOUNTER — Encounter: Payer: Self-pay | Admitting: Dermatology

## 2020-05-19 ENCOUNTER — Encounter: Payer: 59 | Admitting: Dermatology

## 2020-08-04 ENCOUNTER — Encounter: Payer: 59 | Admitting: Dermatology

## 2020-09-08 ENCOUNTER — Encounter: Payer: 59 | Admitting: Dermatology

## 2020-10-06 ENCOUNTER — Ambulatory Visit: Payer: 59 | Admitting: Dermatology

## 2020-10-06 ENCOUNTER — Telehealth: Payer: Self-pay

## 2020-10-06 ENCOUNTER — Other Ambulatory Visit: Payer: Self-pay

## 2020-10-06 DIAGNOSIS — L905 Scar conditions and fibrosis of skin: Secondary | ICD-10-CM

## 2020-10-06 DIAGNOSIS — D492 Neoplasm of unspecified behavior of bone, soft tissue, and skin: Secondary | ICD-10-CM

## 2020-10-06 DIAGNOSIS — L720 Epidermal cyst: Secondary | ICD-10-CM

## 2020-10-06 MED ORDER — MUPIROCIN 2 % EX OINT: 1.0000 "application " | TOPICAL_OINTMENT | Freq: Every day | CUTANEOUS | 1 refills | Status: DC

## 2020-10-06 NOTE — Progress Notes (Signed)
   Follow-Up Visit   Subjective  Kristin Espinoza is a 50 y.o. female who presents for the following: Cyst (Vs other of left temple/ant sideburn - Excise today). The patient would like to discuss scarring on her cheek from past acne.  The following portions of the chart were reviewed this encounter and updated as appropriate:   Tobacco  Allergies  Meds  Problems  Med Hx  Surg Hx  Fam Hx     Review of Systems:  No other skin or systemic complaints except as noted in HPI or Assessment and Plan.  Objective  Well appearing patient in no apparent distress; mood and affect are within normal limits.  A focused examination was performed including face. Relevant physical exam findings are noted in the Assessment and Plan.  Objective  L temple/sideburn: Cystic pap 1.4cm  Objective  bil cheeks: Minimal acne scarring cheeks   Assessment & Plan  Neoplasm of skin L temple/sideburn  Skin excision  Lesion length (cm):  1.4 Lesion width (cm):  1.4 Margin per side (cm):  0 Total excision diameter (cm):  1.4 Informed consent: discussed and consent obtained   Timeout: patient name, date of birth, surgical site, and procedure verified   Procedure prep:  Patient was prepped and draped in usual sterile fashion Prep type:  Isopropyl alcohol and povidone-iodine Anesthesia: the lesion was anesthetized in a standard fashion   Anesthetic:  1% lidocaine w/ epinephrine 1-100,000 buffered w/ 8.4% NaHCO3 (3.0cc) Instrument used comment:  #15c blade Hemostasis achieved with: pressure   Hemostasis achieved with comment:  Electrocautery Outcome: patient tolerated procedure well with no complications   Post-procedure details: sterile dressing applied and wound care instructions given   Dressing type: pressure dressing and bacitracin (Mupirocin)    Skin repair Complexity:  Complex Final length (cm):  2 Reason for type of repair: reduce tension to allow closure, reduce the risk of  dehiscence, infection, and necrosis, reduce subcutaneous dead space and avoid a hematoma, allow closure of the large defect, preserve normal anatomy, preserve normal anatomical and functional relationships and enhance both functionality and cosmetic results   Undermining: area extensively undermined   Undermining comment:  Undermining Defect 1.4cm Subcutaneous layers (deep stitches):  Suture size:  5-0 Suture type: Vicryl (polyglactin 910)   Subcutaneous suture technique: Inverted Dermal. Fine/surface layer approximation (top stitches):  Suture size:  5-0 Suture type: nylon   Stitches: simple running   Suture removal (days):  7 Hemostasis achieved with: pressure Outcome: patient tolerated procedure well with no complications   Post-procedure details: sterile dressing applied and wound care instructions given   Dressing type: bandage, pressure dressing and bacitracin (Mupirocin)    mupirocin ointment (BACTROBAN) 2 %  Specimen 1 - Surgical pathology Differential Diagnosis: D48.5 Cyst vs other  Check Margins: No Cystic pap 1.4cm  Cyst vs other  Start Mupirocin oint qd to excision site  Scars of the cheeks from past acne bil cheeks Discussed Fraxel laser, Halo laser or an aggressive BBL treatment.  Will refer pt to [facility]  Return in about 1 week (around 10/13/2020) for suture removal.  I, Othelia Pulling, RMA, am acting as scribe for Sarina Ser, MD .  Documentation: I have reviewed the above documentation for accuracy and completeness, and I agree with the above.  Sarina Ser, MD

## 2020-10-06 NOTE — Patient Instructions (Signed)

## 2020-10-06 NOTE — Telephone Encounter (Signed)
Left pt a message to call if any problems after today's surgery./sh 

## 2020-10-10 ENCOUNTER — Encounter: Payer: Self-pay | Admitting: Dermatology

## 2020-10-13 ENCOUNTER — Other Ambulatory Visit: Payer: Self-pay

## 2020-10-13 ENCOUNTER — Ambulatory Visit (INDEPENDENT_AMBULATORY_CARE_PROVIDER_SITE_OTHER): Payer: 59

## 2020-10-13 DIAGNOSIS — Z4802 Encounter for removal of sutures: Secondary | ICD-10-CM

## 2020-10-13 DIAGNOSIS — L720 Epidermal cyst: Secondary | ICD-10-CM

## 2020-10-13 NOTE — Patient Instructions (Signed)

## 2020-10-13 NOTE — Progress Notes (Signed)
   Follow-Up Visit   Subjective  Kristin Espinoza is a 50 y.o. female who presents for the following: Suture / Staple Removal (7 day post excision suture removal for biopsy proven epidermoid cyst. ).    The following portions of the chart were reviewed this encounter and updated as appropriate:        Objective  Well appearing patient in no apparent distress; mood and affect are within normal limits.    Objective  left temple/sideburn: 7 day post excision suture removal.   Incision site is clean, dry and intact   Assessment & Plan  Epidermoid cyst left temple/sideburn  Encounter for Removal of Sutures - Incision site at the left temple/sideburn is clean, dry and intact - Wound cleansed, sutures removed, wound cleansed and mupirocin applied.  - Discussed pathology results showing epidermoid cyst  - Patient advised to call with any concerns or if they notice any new or changing lesions.   No follow-ups on file.   I, Harriett Sine, CMA, am acting as scribe for [employer], CMA.

## 2021-01-20 ENCOUNTER — Telehealth: Payer: Self-pay

## 2021-01-20 MED ORDER — VALACYCLOVIR HCL 500 MG PO TABS: 500.0000 mg | ORAL_TABLET | ORAL | 11 refills | Status: DC

## 2021-01-20 NOTE — Telephone Encounter (Signed)
Patient has active cold sore and would like a Valtrex RX. She has been here recently. What dosing would you like to send in for patient? [facility] in [place].

## 2021-01-20 NOTE — Telephone Encounter (Signed)
Patient advised and RX sent in 

## 2021-06-30 ENCOUNTER — Ambulatory Visit: Payer: BLUE CROSS/BLUE SHIELD | Admitting: Dermatology

## 2021-06-30 ENCOUNTER — Other Ambulatory Visit: Payer: Self-pay

## 2021-07-19 ENCOUNTER — Other Ambulatory Visit: Payer: Self-pay | Admitting: Family

## 2021-07-19 DIAGNOSIS — Z1231 Encounter for screening mammogram for malignant neoplasm of breast: Secondary | ICD-10-CM

## 2021-08-05 ENCOUNTER — Telehealth: Payer: Self-pay

## 2021-08-05 ENCOUNTER — Other Ambulatory Visit: Payer: Self-pay

## 2021-08-05 ENCOUNTER — Ambulatory Visit: Payer: 59 | Admitting: Dermatology

## 2021-08-05 DIAGNOSIS — L2089 Other atopic dermatitis: Secondary | ICD-10-CM | POA: Diagnosis not present

## 2021-08-05 DIAGNOSIS — L738 Other specified follicular disorders: Secondary | ICD-10-CM | POA: Diagnosis not present

## 2021-08-05 DIAGNOSIS — L720 Epidermal cyst: Secondary | ICD-10-CM

## 2021-08-05 DIAGNOSIS — L700 Acne vulgaris: Secondary | ICD-10-CM | POA: Diagnosis not present

## 2021-08-05 MED ORDER — MOMETASONE FUROATE 0.1 % EX CREA: 1.0000 "application " | TOPICAL_CREAM | Freq: Every day | CUTANEOUS | 1 refills | Status: DC | PRN

## 2021-08-05 MED ORDER — CLINDAMYCIN-TRETINOIN 1.2-0.025 % EX GEL: Freq: Every day | CUTANEOUS | 0 refills | Status: DC

## 2021-08-05 NOTE — Patient Instructions (Addendum)
Start Ziana gel nightly as tolerated. Follow with serums and moisturizers.  ? ?Topical retinoid medications like tretinoin/Retin-A, adapalene/Differin, tazarotene/Fabior, and Epiduo/Epiduo Forte can cause dryness and irritation when first started. Only apply a pea-sized amount to the entire affected area. Avoid applying it around the eyes, edges of mouth and creases at the nose. If you experience irritation, use a good moisturizer first and/or apply the medicine less often. If you are doing well with the medicine, you can increase how often you use it until you are applying every night. Be careful with sun protection while using this medication as it can make you sensitive to the sun. This medicine should not be used by pregnant women.  ? ? ?Start mometasone cream 1-2 times daily to legs for dermatitis as needed. Avoid applying to face, groin, and axilla. Use as directed. Long-term use can cause thinning of the skin. ? ?Topical steroids (such as triamcinolone, fluocinolone, fluocinonide, mometasone, clobetasol, halobetasol, betamethasone, hydrocortisone) can cause thinning and lightening of the skin if they are used for too long in the same area. Your physician has selected the right strength medicine for your problem and area affected on the body. Please use your medication only as directed by your physician to prevent side effects.  ? ?Recommend CeraVe cream daily.  ? ?If You Need Anything After Your Visit ? ?If you have any questions or concerns for your doctor, please call our main line at [phone] and press option 4 to reach your doctor's medical assistant. If no one answers, please leave a voicemail as directed and we will return your call as soon as possible. Messages left after 4 pm will be answered the following business day.  ? ?You may also send [place] a message via MyChart. We typically respond to MyChart messages within 1-2 business days. ? ?For prescription refills, please ask your pharmacy to contact  our office. Our fax number is [phone]. ? ?If you have an urgent issue when the clinic is closed that cannot wait until the next business day, you can page your doctor at the number below.   ? ?Please note that while we do our best to be available for urgent issues outside of office hours, we are not available 24/7.  ? ?If you have an urgent issue and are unable to reach [place], you may choose to seek medical care at your doctor's office, retail clinic, urgent care center, or emergency room. ? ?If you have a medical emergency, please immediately call 911 or go to the emergency department. ? ?Pager Numbers ? ?- Dr. Nehemiah Massed: [phone] ? ?- Dr. Laurence Ferrari: [phone] ? ?- Dr. Nicole Kindred: [phone] ? ?In the event of inclement weather, please call our main line at [phone] for an update on the status of any delays or closures. ? ?Dermatology Medication Tips: ?Please keep the boxes that topical medications come in in order to help keep track of the instructions about where and how to use these. Pharmacies typically print the medication instructions only on the boxes and not directly on the medication tubes.  ? ?If your medication is too expensive, please contact our office at [phone] option 4 or send [place] a message through [place].  ? ?We are unable to tell what your co-pay for medications will be in advance as this is different depending on your insurance coverage. However, we may be able to find a substitute medication at lower cost or fill out paperwork to get insurance to cover a needed medication.  ? ?If a  prior authorization is required to get your medication covered by your insurance company, please allow [place] 1-2 business days to complete this process. ? ?Drug prices often vary depending on where the prescription is filled and some pharmacies may offer cheaper prices. ? ?The website www.goodrx.com contains coupons for medications through different pharmacies. The prices here do not account for what the cost  may be with help from insurance (it may be cheaper with your insurance), but the website can give you the price if you did not use any insurance.  ?- You can print the associated coupon and take it with your prescription to the pharmacy.  ?- You may also stop by our office during regular business hours and pick up a GoodRx coupon card.  ?- If you need your prescription sent electronically to a different pharmacy, notify our office through [facility] or by phone at [phone] option 4. ? ? ? ? ?Si Usted Necesita Algo Despu?s de Su Visita ? ?Tambi?n puede enviarnos un mensaje a trav?s de MyChart. Por lo general respondemos a los mensajes de MyChart en el transcurso de 1 a 2 d?as h?biles. ? ?Para renovar recetas, por favor pida a su farmacia que se ponga en contacto con nuestra oficina. Nuestro n?mero de fax es el [phone]. ? ?Si tiene un asunto urgente cuando la cl?nica est? cerrada y que no puede esperar hasta el siguiente d?a h?bil, puede llamar/localizar a su doctor(a) al n?mero que aparece a continuaci?n.  ? ?Por favor, tenga en cuenta que aunque hacemos todo lo posible para estar disponibles para asuntos urgentes fuera del horario de oficina, no estamos disponibles las 24 horas del d?a, los 7 d?as de la semana.  ? ?Si tiene un problema urgente y no puede comunicarse con nosotros, puede optar por buscar atenci?n m?dica  en el consultorio de su doctor(a), en una cl?nica privada, en un centro de atenci?n urgente o en una sala de emergencias. ? ?Si tiene Engineer, maintenance (IT) m?dica, por favor llame inmediatamente al 911 o vaya a la sala de emergencias. ? ?N?meros de b?per ? ?- Dr. Nehemiah Massed: [phone] ? ?- Dra. Moye: [phone] ? ?- Dra. Nicole Kindred: [phone] ? ?En caso de inclemencias del tiempo, por favor llame a nuestra l?nea principal al [phone] para una actualizaci?n sobre el estado de cualquier retraso o cierre. ? ?Consejos para la medicaci?n en dermatolog?a: ?Por favor, guarde las cajas en  las que vienen los medicamentos de uso t?pico para ayudarle a seguir las instrucciones sobre d?nde y c?mo usarlos. Las farmacias generalmente imprimen las instrucciones del medicamento s?lo en las cajas y no directamente en los tubos del [place].  ? ?Si su medicamento es muy caro, por favor, p?ngase en contacto con Zigmund Daniel llamando al [phone] y presione la opci?n 4 o env?enos un mensaje a trav?s de MyChart.  ? ?No podemos decirle cu?l ser? su copago por los medicamentos por adelantado ya que esto es diferente dependiendo de la cobertura de su seguro. Sin embargo, es posible que podamos encontrar un medicamento sustituto a Electrical engineer un formulario para que el seguro cubra el medicamento que se considera necesario.  ? ?Si se requiere Ardelia Mems autorizaci?n previa para que su compa??a de seguros [place] su medicamento, por favor perm?tanos de 1 a 2 d?as h?biles para completar este proceso. ? ?Los precios de los medicamentos var?an con frecuencia dependiendo del Environmental consultant de d?nde se surte la receta y alguna farmacias pueden ofrecer precios m?s baratos. ? ?El sitio web www.goodrx.com tiene cupones  para medicamentos de Airline pilot. Los precios aqu? no tienen en cuenta lo que podr?a costar con la ayuda del seguro (puede ser m?s barato con su seguro), pero el sitio web puede darle el precio si no utiliz? ning?n seguro.  ?- Puede imprimir el cup?n correspondiente y llevarlo con su receta a la farmacia.  ?- Tambi?n puede pasar por nuestra oficina durante el horario de atenci?n regular y recoger una tarjeta de cupones de GoodRx.  ?- Si necesita que su receta se env?e electr?nicamente a Chiropodist, informe a nuestra oficina a trav?s de MyChart de [place] o por tel?fono llamando al [phone] y presione la opci?n 4. ? ?

## 2021-08-05 NOTE — Progress Notes (Signed)
? ?Follow-Up Visit ?  ?Subjective  ?Kristin Espinoza is a 51 y.o. female who presents for the following: Acne (Patient with acne at face, not using anything prescription. She has used adapalene 0.3% gel in the past. Patient advises she mostly gets whiteheads and blackheads. ) and Skin Problem (Patient here today for dry areas that come up on my legs, occasionally they do itch. ). ?Patient with some areas at vagina, possible milia that may come from waxing.  ?The patient has spots, moles and lesions to be evaluated, some may be new or changing and the patient has concerns that these could be cancer. ? ?The following portions of the chart were reviewed this encounter and updated as appropriate:  ? Tobacco  Allergies  Meds  Problems  Med Hx  Surg Hx  Fam Hx   ?  ?Review of Systems:  No other skin or systemic complaints except as noted in HPI or Assessment and Plan. ? ?Objective  ?Well appearing patient in no apparent distress; mood and affect are within normal limits. ? ?A focused examination was performed including face, legs, groin. Relevant physical exam findings are noted in the Assessment and Plan. ? ?face ?Comedones  ? ?lower legs ?Pink patches ? ?right labia ?Smooth white papule(s).  ? ?Pubic ?Small yellow papules with a central dell. ? ?right labia ?Subcutaneous nodule.  ? ?Assessment & Plan  ?Acne vulgaris ?face ?Start Ziana gel nightly as tolerated.  ? ?If too expensive can send in Skin Medicinals Dapsone 7.8%, tretinoin 0.025%.  ? ?Topical retinoid medications like tretinoin/Retin-A, adapalene/Differin, tazarotene/Fabior, and Epiduo/Epiduo Forte can cause dryness and irritation when first started. Only apply a pea-sized amount to the entire affected area. Avoid applying it around the eyes, edges of mouth and creases at the nose. If you experience irritation, use a good moisturizer first and/or apply the medicine less often. If you are doing well with the medicine, you can increase how often you  use it until you are applying every night. Be careful with sun protection while using this medication as it can make you sensitive to the sun. This medicine should not be used by pregnant women.  ? ?clindamycin-tretinoin (ZIANA) gel - face ?Apply topically at bedtime. ? ?Other atopic dermatitis ?lower legs ?Start mometasone cream 1-2 times daily to legs for dermatitis as needed. Avoid applying to face, groin, and axilla. Use as directed. Long-term use can cause thinning of the skin. ? ?Atopic dermatitis (eczema) is a chronic, relapsing, pruritic condition that can significantly affect quality of life. It is often associated with allergic rhinitis and/or asthma and can require treatment with topical medications, phototherapy, or in severe cases biologic injectable medication (Dupixent; Adbry) or Oral JAK inhibitors. ? ?Topical steroids (such as triamcinolone, fluocinolone, fluocinonide, mometasone, clobetasol, halobetasol, betamethasone, hydrocortisone) can cause thinning and lightening of the skin if they are used for too long in the same area. Your physician has selected the right strength medicine for your problem and area affected on the body. Please use your medication only as directed by your physician to prevent side effects.  ? ?Recommend CeraVe cream daily.  ? ?mometasone (ELOCON) 0.1 % cream - lower legs ?Apply 1 application. topically daily as needed (Rash). Avoid applying to face, groin, and axilla. Use as directed. Long-term use can cause thinning of the skin. ? ?Milia ?right labia ?Benign-appearing.  Observation.  Call clinic for new or changing lesions.  Recommend daily use of broad spectrum spf 30+ sunscreen to sun-exposed areas.  ? ?Sebaceous  hyperplasia ?Pubic area and labia majora ?Benign-appearing.  Observation.  Call clinic for new or changing lesions.  Recommend daily use of broad spectrum spf 30+ sunscreen to sun-exposed areas.  ? ?Epidermal inclusion cyst ?right labia majora ?Benign-appearing.  Exam most consistent with an epidermal inclusion cyst. Discussed that a cyst is a benign growth that can grow over time and sometimes get irritated or inflamed. Recommend observation if it is not bothersome. Discussed option of surgical excision to remove it if it is growing, symptomatic, or other changes noted. Please call for new or changing lesions so they can be evaluated. ? ?Return for Surgery, next available. ? ?Graciella Belton, RMA, am acting as scribe for Sarina Ser, MD . ?Documentation: I have reviewed the above documentation for accuracy and completeness, and I agree with the above. ? ?Sarina Ser, MD ? ?

## 2021-08-05 NOTE — Telephone Encounter (Signed)
Ziana gel not covered.  ?Patient wants to do the Dapsone/Tretinoin Skin Medicinals product.  ?RX sent in. aw ?

## 2021-08-09 ENCOUNTER — Encounter: Payer: Self-pay | Admitting: Dermatology

## 2021-09-21 ENCOUNTER — Encounter: Payer: 59 | Admitting: Dermatology

## 2021-11-30 ENCOUNTER — Encounter: Payer: 59 | Admitting: Dermatology

## 2022-04-05 DIAGNOSIS — Z111 Encounter for screening for respiratory tuberculosis: Secondary | ICD-10-CM | POA: Diagnosis not present

## 2022-05-04 DIAGNOSIS — J029 Acute pharyngitis, unspecified: Secondary | ICD-10-CM | POA: Diagnosis not present

## 2022-05-04 DIAGNOSIS — J020 Streptococcal pharyngitis: Secondary | ICD-10-CM | POA: Diagnosis not present

## 2022-05-12 DIAGNOSIS — J020 Streptococcal pharyngitis: Secondary | ICD-10-CM | POA: Diagnosis not present

## 2022-05-26 DIAGNOSIS — R059 Cough, unspecified: Secondary | ICD-10-CM | POA: Diagnosis not present

## 2022-05-26 DIAGNOSIS — U071 COVID-19: Secondary | ICD-10-CM | POA: Diagnosis not present

## 2023-01-05 ENCOUNTER — Encounter: Payer: Self-pay | Admitting: Internal Medicine

## 2023-01-05 ENCOUNTER — Ambulatory Visit: Payer: Medicaid Other | Admitting: Internal Medicine

## 2023-01-05 VITALS — BP 120/79 | HR 82 | Ht 64.0 in | Wt 187.2 lb

## 2023-01-05 DIAGNOSIS — E559 Vitamin D deficiency, unspecified: Secondary | ICD-10-CM

## 2023-01-05 DIAGNOSIS — E538 Deficiency of other specified B group vitamins: Secondary | ICD-10-CM

## 2023-01-05 DIAGNOSIS — L720 Epidermal cyst: Secondary | ICD-10-CM

## 2023-01-05 DIAGNOSIS — F17200 Nicotine dependence, unspecified, uncomplicated: Secondary | ICD-10-CM | POA: Diagnosis not present

## 2023-01-05 DIAGNOSIS — D508 Other iron deficiency anemias: Secondary | ICD-10-CM

## 2023-01-05 DIAGNOSIS — R5383 Other fatigue: Secondary | ICD-10-CM | POA: Diagnosis not present

## 2023-01-05 MED ORDER — BUPROPION HCL ER (XL) 150 MG PO TB24: 150.0000 mg | ORAL_TABLET | ORAL | 2 refills | Status: DC

## 2023-01-05 NOTE — Progress Notes (Signed)
Established Patient Office Visit  Subjective:  Patient ID: Kristin Espinoza, female    DOB: Jul 27, 1970  Age: 52 y.o. MRN: [id number]  Chief Complaint  Patient presents with   Follow-up    Possible infection    Patient has not been in this office since March 2023.  Says that she is working 2 jobs and also going to school. Complains of feeling tired, not resting enough, is under stress and has gained weight.  Also would like help to quit smoking. Agrees to try Wellbutrin. Patient has not taken her iron supplements for a while.  She has history of iron deficiency anemia. Will check labs today and restart her supplement. Patient is also concerned about clusters of tiny lesions filled with white material, not pustules or folliculitis but  look more like milia, located on both her breasts just beneath the areola.  There is no rash under the breast, no itching or redness. Patient admits that she gets her private areas faxed and similar clusters of monilia have appeared over there as well. Will set up with dermatologist for management of this unusual location of milia.    No other concerns at this time.   Past Medical History:  Diagnosis Date   Anemia    Heart murmur     History reviewed. No pertinent surgical history.  Social History   Socioeconomic History   Marital status: Single    Spouse name: Not on file   Number of children: Not on file   Years of education: Not on file   Highest education level: Not on file  Occupational History   Not on file  Tobacco Use   Smoking status: Every Day    Current packs/day: 0.50    Types: Cigarettes   Smokeless tobacco: Never  Vaping Use   Vaping status: Some Days  Substance and Sexual Activity   Alcohol use: No    Comment: social   Drug use: Never   Sexual activity: Yes    Birth control/protection: None  Other Topics Concern   Not on file  Social History Narrative   Not on file   Social Determinants of Health    Financial Resource Strain: Not on file  Food Insecurity: Not on file  Transportation Needs: Not on file  Physical Activity: Not on file  Stress: Not on file  Social Connections: Not on file  Intimate Partner Violence: Not on file    Family History  Problem Relation Age of Onset   Breast cancer Neg Hx     No Known Allergies  Review of Systems  Constitutional:  Positive for malaise/fatigue. Negative for chills, diaphoresis, fever and weight loss.  HENT: Negative.  Negative for congestion and sore throat.   Eyes: Negative.   Respiratory: Negative.  Negative for cough, shortness of breath and wheezing.   Cardiovascular: Negative.  Negative for chest pain, palpitations and leg swelling.  Gastrointestinal: Negative.  Negative for abdominal pain, constipation, diarrhea, heartburn, nausea and vomiting.  Genitourinary: Negative.  Negative for dysuria and flank pain.  Musculoskeletal: Negative.  Negative for joint pain and myalgias.  Skin: Negative.   Neurological: Negative.  Negative for dizziness and headaches.  Endo/Heme/Allergies: Negative.   Psychiatric/Behavioral: Negative.  Negative for depression and suicidal ideas. The patient is not nervous/anxious.        Objective:   BP 120/79   Pulse 82   Ht 5\' 4"  (1.626 m)   Wt 187 lb 3.2 oz (84.9 kg)   SpO2 98%  BMI 32.13 kg/m   Vitals:   01/05/23 1439  BP: 120/79  Pulse: 82  Height: 5\' 4"  (1.626 m)  Weight: 187 lb 3.2 oz (84.9 kg)  SpO2: 98%  BMI (Calculated): 32.12    Physical Exam Vitals and nursing note reviewed.  Constitutional:      Appearance: Normal appearance.  HENT:     Head: Normocephalic and atraumatic.     Nose: Nose normal.     Mouth/Throat:     Mouth: Mucous membranes are moist.     Pharynx: Oropharynx is clear.  Eyes:     Conjunctiva/sclera: Conjunctivae normal.     Pupils: Pupils are equal, round, and reactive to light.  Cardiovascular:     Rate and Rhythm: Normal rate and regular rhythm.      Pulses: Normal pulses.     Heart sounds: Normal heart sounds. No murmur heard. Pulmonary:     Effort: Pulmonary effort is normal.     Breath sounds: Normal breath sounds. No wheezing.  Abdominal:     General: Bowel sounds are normal.     Palpations: Abdomen is soft.     Tenderness: There is no abdominal tenderness. There is no right CVA tenderness or left CVA tenderness.  Musculoskeletal:        General: Normal range of motion.     Cervical back: Normal range of motion.     Right lower leg: No edema.     Left lower leg: No edema.  Skin:    General: Skin is warm and dry.  Neurological:     General: No focal deficit present.     Mental Status: She is alert and oriented to person, place, and time.  Psychiatric:        Mood and Affect: Mood normal.        Behavior: Behavior normal.      No results found for any visits on 01/05/23.  No results found for this or any previous visit (from the past 2160 hour(s)).    Assessment & Plan:  Check labs today. Start Wellbutrin. Will need an breast exam at follow-up/CPE. Also to get PHQ-9/GAD next visit. Dermatology consult for milia type lesions under her breast and perineum. Problem List Items Addressed This Visit   None Visit Diagnoses     Nicotine dependence, uncomplicated, unspecified nicotine product type    -  Primary   Relevant Medications   buPROPion (WELLBUTRIN XL) 150 MG 24 hr tablet   Vitamin B12 deficiency       Relevant Orders   Vitamin B12   Vitamin D deficiency       Relevant Orders   Vitamin D (25 hydroxy)   Other fatigue       Relevant Orders   CMP14+EGFR   TSH   Other iron deficiency anemia       Relevant Orders   CBC with Diff   Iron and IBC ([id number])   Ferritin   Milial cyst       Relevant Orders   Ambulatory referral to Dermatology       Return in about 1 month (around 02/05/2023).   Total time spent: 30 minutes  Margaretann Loveless, MD  01/05/2023   This document may have been  prepared by [facility] Voice Recognition software and as such may include unintentional dictation errors.

## 2023-01-06 ENCOUNTER — Telehealth: Payer: Self-pay | Admitting: Internal Medicine

## 2023-01-06 LAB — CMP14+EGFR
ALT: 20 IU/L (ref 0–32)
AST: 20 IU/L (ref 0–40)
Albumin: 4.3 g/dL (ref 3.8–4.9)
Alkaline Phosphatase: 86 IU/L (ref 44–121)
BUN/Creatinine Ratio: 13 (ref 9–23)
BUN: 12 mg/dL (ref 6–24)
Bilirubin Total: <0.2 mg/dL (ref 0.0–1.2)
CO2: 22 mmol/L (ref 20–29)
Calcium: 11.1 mg/dL — ABNORMAL HIGH (ref 8.7–10.2)
Chloride: 107 mmol/L — ABNORMAL HIGH (ref 96–106)
Creatinine, Ser: 0.9 mg/dL (ref 0.57–1.00)
Globulin, Total: 2.8 g/dL (ref 1.5–4.5)
Glucose: 93 mg/dL (ref 70–99)
Potassium: 4.2 mmol/L (ref 3.5–5.2)
Sodium: 141 mmol/L (ref 134–144)
Total Protein: 7.1 g/dL (ref 6.0–8.5)
eGFR: 76 mL/min/{1.73_m2} (ref >59)

## 2023-01-06 LAB — CBC WITH DIFFERENTIAL/PLATELET
Basophils Absolute: 0.1 10*3/uL (ref 0.0–0.2)
Basos: 1 %
EOS (ABSOLUTE): 0.2 10*3/uL (ref 0.0–0.4)
Eos: 3 %
Hematocrit: 39.9 % (ref 34.0–46.6)
Hemoglobin: 13.2 g/dL (ref 11.1–15.9)
Immature Grans (Abs): 0 10*3/uL (ref 0.0–0.1)
Immature Granulocytes: 0 %
Lymphocytes Absolute: 2.2 10*3/uL (ref 0.7–3.1)
Lymphs: 32 %
MCH: 29.7 pg (ref 26.6–33.0)
MCHC: 33.1 g/dL (ref 31.5–35.7)
MCV: 90 fL (ref 79–97)
Monocytes Absolute: 0.6 10*3/uL (ref 0.1–0.9)
Monocytes: 8 %
Neutrophils Absolute: 3.9 10*3/uL (ref 1.4–7.0)
Neutrophils: 56 %
Platelets: 258 10*3/uL (ref 150–450)
RBC: 4.45 x10E6/uL (ref 3.77–5.28)
RDW: 16.7 % — ABNORMAL HIGH (ref 11.7–15.4)
WBC: 6.8 10*3/uL (ref 3.4–10.8)

## 2023-01-06 LAB — IRON AND TIBC
Iron Saturation: 19 % (ref 15–55)
Iron: 73 ug/dL (ref 27–159)
Total Iron Binding Capacity: 387 ug/dL (ref 250–450)
UIBC: 314 ug/dL (ref 131–425)

## 2023-01-06 LAB — FERRITIN: Ferritin: 18 ng/mL (ref 15–150)

## 2023-01-06 LAB — VITAMIN D 25 HYDROXY (VIT D DEFICIENCY, FRACTURES): Vit D, 25-Hydroxy: 23 ng/mL — ABNORMAL LOW (ref 30.0–100.0)

## 2023-01-06 LAB — TSH: TSH: 1.56 u[IU]/mL (ref 0.450–4.500)

## 2023-01-06 LAB — VITAMIN B12: Vitamin B-12: 450 pg/mL (ref 232–1245)

## 2023-01-06 NOTE — Progress Notes (Signed)
Patient notified

## 2023-01-06 NOTE — Telephone Encounter (Signed)
Patient called for lab results. Results given to patient.

## 2023-01-09 ENCOUNTER — Ambulatory Visit: Payer: Medicaid Other | Admitting: Internal Medicine

## 2023-01-09 ENCOUNTER — Encounter: Payer: Self-pay | Admitting: Internal Medicine

## 2023-01-09 VITALS — BP 136/88 | HR 58 | Temp 96.9°F | Ht 64.0 in | Wt 188.8 lb

## 2023-01-09 DIAGNOSIS — R5383 Other fatigue: Secondary | ICD-10-CM

## 2023-01-09 DIAGNOSIS — R6889 Other general symptoms and signs: Secondary | ICD-10-CM

## 2023-01-09 DIAGNOSIS — D508 Other iron deficiency anemias: Secondary | ICD-10-CM

## 2023-01-09 DIAGNOSIS — D649 Anemia, unspecified: Secondary | ICD-10-CM | POA: Insufficient documentation

## 2023-01-09 DIAGNOSIS — J069 Acute upper respiratory infection, unspecified: Secondary | ICD-10-CM

## 2023-01-09 LAB — POCT XPERT XPRESS SARS COVID-2/FLU/RSV
FLU A: NEGATIVE
FLU B: NEGATIVE
RSV RNA, PCR: NEGATIVE
SARS Coronavirus 2: NEGATIVE

## 2023-01-09 NOTE — Progress Notes (Signed)
Established Patient Office Visit  Subjective:  Patient ID: Kristin Espinoza, female    DOB: 11-06-1970  Age: 52 y.o. MRN: [id number]  Chief Complaint  Patient presents with   Acute Visit    Headache, fever, fatigue, chills, diarrhea. Taking Tylenol and ibuprofen.    Patient comes in with complaints of headache, nausea, diarrhea and generalized fatigue.  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  She does have a mild sore throat as well.  Her home COVID test was negative.  She does feel chills intermittently.  There is no cough or chest congestion but does have mild bodyaches. Suspect viral syndrome. COVID/flu/RSV is negative in office. Advised to rest, fluids, Tylenol. Patient advised to let us know if anything changes.    No other concerns at this time.   Past Medical History:  Diagnosis Date   Anemia    Heart murmur     History reviewed. No pertinent surgical history.  Social History   Socioeconomic History   Marital status: Single    Spouse name: Not on file   Number of children: Not on file   Years of education: Not on file   Highest education level: Not on file  Occupational History   Not on file  Tobacco Use   Smoking status: Every Day    Current packs/day: 0.50    Types: Cigarettes   Smokeless tobacco: Never  Vaping Use   Vaping status: Some Days  Substance and Sexual Activity   Alcohol use: No    Comment: social   Drug use: Never   Sexual activity: Yes    Birth control/protection: None  Other Topics Concern   Not on file  Social History Narrative   Not on file   Social Determinants of Health   Financial Resource Strain: Not on file  Food Insecurity: Not on file  Transportation Needs: Not on file  Physical Activity: Not on file  Stress: Not on file  Social Connections: Not on file  Intimate Partner Violence: Not on file    Family History  Problem Relation Age of Onset   Breast cancer Neg Hx     No Known  Allergies  Review of Systems  Constitutional:  Positive for chills and malaise/fatigue. Negative for diaphoresis, fever and weight loss.  HENT:  Positive for sore throat. Negative for congestion, ear pain, hearing loss and sinus pain.   Eyes: Negative.   Respiratory: Negative.  Negative for cough, shortness of breath and stridor.   Cardiovascular: Negative.  Negative for chest pain, palpitations and leg swelling.  Gastrointestinal: Negative.  Negative for abdominal pain, constipation, diarrhea, heartburn, nausea and vomiting.  Genitourinary: Negative.  Negative for dysuria and flank pain.  Musculoskeletal:  Positive for myalgias. Negative for back pain and joint pain.  Skin: Negative.   Neurological:  Positive for headaches. Negative for dizziness, tingling and tremors.  Endo/Heme/Allergies: Negative.   Psychiatric/Behavioral: Negative.  Negative for depression and suicidal ideas. The patient is not nervous/anxious.        Objective:   BP 136/88   Pulse (!) 58   Temp (!) 96.9 F (36.1 C) (Tympanic)   Ht 5\' 4"  (1.626 m)   Wt 188 lb 12.8 oz (85.6 kg)   SpO2 98%   BMI 32.41 kg/m   Vitals:   01/09/23 1306  BP: 136/88  Pulse: (!) 58  Temp: (!) 96.9 F (36.1 C)  Height: 5\' 4"  (1.626 m)  Weight: 188 lb 12.8 oz (  85.6 kg)  SpO2: 98%  TempSrc: Tympanic  BMI (Calculated): 32.39    Physical Exam Vitals and nursing note reviewed.  Constitutional:      Appearance: Normal appearance.  HENT:     Head: Normocephalic and atraumatic.     Nose: Nose normal.     Mouth/Throat:     Mouth: Mucous membranes are moist.     Pharynx: Oropharynx is clear.  Eyes:     Conjunctiva/sclera: Conjunctivae normal.     Pupils: Pupils are equal, round, and reactive to light.  Cardiovascular:     Rate and Rhythm: Normal rate and regular rhythm.     Pulses: Normal pulses.     Heart sounds: Normal heart sounds. No murmur heard. Pulmonary:     Effort: Pulmonary effort is normal.     Breath  sounds: Normal breath sounds. No wheezing.  Abdominal:     General: Bowel sounds are normal.     Palpations: Abdomen is soft.     Tenderness: There is no abdominal tenderness. There is no right CVA tenderness or left CVA tenderness.  Musculoskeletal:        General: Normal range of motion.     Cervical back: Normal range of motion.     Right lower leg: No edema.     Left lower leg: No edema.  Skin:    General: Skin is warm and dry.  Neurological:     General: No focal deficit present.     Mental Status: She is alert and oriented to person, place, and time.  Psychiatric:        Mood and Affect: Mood normal.        Behavior: Behavior normal.      Results for orders placed or performed in visit on 01/09/23  POCT XPERT XPRESS SARS COVID-2/FLU/RSV  Result Value Ref Range   SARS Coronavirus 2 neg    FLU A neg    FLU B neg    RSV RNA, PCR neg     Recent Results (from the past 2160 hour(s))  CBC with Diff     Status: Abnormal   Collection Time: 01/05/23  3:07 PM  Result Value Ref Range   WBC 6.8 3.4 - 10.8 x10E3/uL   RBC 4.45 3.77 - 5.28 x10E6/uL   Hemoglobin 13.2 11.1 - 15.9 g/dL   Hematocrit 86.5 78.4 - 46.6 %   MCV 90 79 - 97 fL   MCH 29.7 26.6 - 33.0 pg   MCHC 33.1 31.5 - 35.7 g/dL   RDW 69.6 (H) 29.5 - 28.4 %   Platelets 258 150 - 450 x10E3/uL   Neutrophils 56 Not Estab. %   Lymphs 32 Not Estab. %   Monocytes 8 Not Estab. %   Eos 3 Not Estab. %   Basos 1 Not Estab. %   Neutrophils Absolute 3.9 1.4 - 7.0 x10E3/uL   Lymphocytes Absolute 2.2 0.7 - 3.1 x10E3/uL   Monocytes Absolute 0.6 0.1 - 0.9 x10E3/uL   EOS (ABSOLUTE) 0.2 0.0 - 0.4 x10E3/uL   Basophils Absolute 0.1 0.0 - 0.2 x10E3/uL   Immature Granulocytes 0 Not Estab. %   Immature Grans (Abs) 0.0 0.0 - 0.1 x10E3/uL  CMP14+EGFR     Status: Abnormal   Collection Time: 01/05/23  3:07 PM  Result Value Ref Range   Glucose 93 70 - 99 mg/dL   BUN 12 6 - 24 mg/dL   Creatinine, Ser 1.32 0.57 - 1.00 mg/dL   eGFR 76  >44 WN/UUV/2.53  BUN/Creatinine Ratio 13 9 - 23   Sodium 141 134 - 144 mmol/L   Potassium 4.2 3.5 - 5.2 mmol/L   Chloride 107 (H) 96 - 106 mmol/L   CO2 22 20 - 29 mmol/L   Calcium 11.1 (H) 8.7 - 10.2 mg/dL   Total Protein 7.1 6.0 - 8.5 g/dL   Albumin 4.3 3.8 - 4.9 g/dL   Globulin, Total 2.8 1.5 - 4.5 g/dL   Bilirubin Total <5.6 0.0 - 1.2 mg/dL   Alkaline Phosphatase 86 44 - 121 IU/L   AST 20 0 - 40 IU/L   ALT 20 0 - 32 IU/L  Iron and IBC ([id number])     Status: None   Collection Time: 01/05/23  3:07 PM  Result Value Ref Range   Total Iron Binding Capacity 387 250 - 450 ug/dL   UIBC 063 016 - 010 ug/dL   Iron 73 27 - 932 ug/dL   Iron Saturation 19 15 - 55 %  Ferritin     Status: None   Collection Time: 01/05/23  3:07 PM  Result Value Ref Range   Ferritin 18 15 - 150 ng/mL  TSH     Status: None   Collection Time: 01/05/23  3:07 PM  Result Value Ref Range   TSH 1.560 0.450 - 4.500 uIU/mL  Vitamin D (25 hydroxy)     Status: Abnormal   Collection Time: 01/05/23  3:07 PM  Result Value Ref Range   Vit D, 25-Hydroxy 23.0 (L) 30.0 - 100.0 ng/mL    Comment: Vitamin D deficiency has been defined by the Institute of Medicine and an Endocrine Society practice guideline as a level of serum 25-OH vitamin D less than 20 ng/mL (1,2). The Endocrine Society went on to further define vitamin D insufficiency as a level between 21 and 29 ng/mL (2). 1. IOM (Institute of Medicine). 2010. Dietary reference    intakes for calcium and D. [place]: The    [employer]. 2. Holick MF, Binkley [place], Bischoff-Ferrari HA, et al.    Evaluation, treatment, and prevention of vitamin D    deficiency: an Endocrine Society clinical practice    guideline. JCEM. 2011 Jul; 96(7):1911-30.   Vitamin B12     Status: None   Collection Time: 01/05/23  3:07 PM  Result Value Ref Range   Vitamin B-12 450 232 - 1,245 pg/mL  POCT XPERT XPRESS SARS COVID-2/FLU/RSV     Status: None   Collection  Time: 01/09/23  2:16 PM  Result Value Ref Range   SARS Coronavirus 2 neg    FLU A neg    FLU B neg    RSV RNA, PCR neg       Assessment & Plan:  Patient advised to rest, drink plenty of fluids, and take Tylenol as needed. Problem List Items Addressed This Visit     Absolute anemia   Other Visit Diagnoses     Flu-like symptoms    -  Primary   Relevant Orders   POCT XPERT XPRESS SARS COVID-2/FLU/RSV (Completed)   Other fatigue           Follow up as scheduled.  Total time spent: 25 minutes  Margaretann Loveless, MD  01/09/2023   This document may have been prepared by [facility] Voice Recognition software and as such may include unintentional dictation errors.

## 2023-01-09 NOTE — Progress Notes (Signed)
Patient notified

## 2023-01-12 ENCOUNTER — Telehealth: Payer: Self-pay

## 2023-01-12 ENCOUNTER — Other Ambulatory Visit: Payer: Self-pay | Admitting: Internal Medicine

## 2023-01-12 DIAGNOSIS — K219 Gastro-esophageal reflux disease without esophagitis: Secondary | ICD-10-CM

## 2023-01-12 MED ORDER — PANTOPRAZOLE SODIUM 40 MG PO TBEC
40.0000 mg | DELAYED_RELEASE_TABLET | Freq: Every day | ORAL | 1 refills | Status: AC
Start: 2023-01-12 — End: 2024-01-12

## 2023-01-19 NOTE — Telephone Encounter (Signed)
Pt spoken to by Katherine Mantle.

## 2023-01-20 ENCOUNTER — Telehealth: Payer: Self-pay | Admitting: Internal Medicine

## 2023-01-20 MED ORDER — DOXYCYCLINE MONOHYDRATE 100 MG PO TABS: 100.0000 mg | ORAL_TABLET | Freq: Two times a day (BID) | ORAL | 0 refills | Status: DC

## 2023-01-20 NOTE — Telephone Encounter (Signed)
Patient left VM that she has an infected pore from waxing again. States she had this about a year ago and Marchelle Folks treated her for it. She is requesting that we send her in something to help with it. Please advise.

## 2023-01-23 ENCOUNTER — Telehealth: Payer: Self-pay | Admitting: Internal Medicine

## 2023-01-23 ENCOUNTER — Other Ambulatory Visit: Payer: Self-pay | Admitting: Internal Medicine

## 2023-01-23 DIAGNOSIS — Z1231 Encounter for screening mammogram for malignant neoplasm of breast: Secondary | ICD-10-CM

## 2023-01-23 MED ORDER — DOXYCYCLINE HYCLATE 100 MG PO CAPS: 100.0000 mg | ORAL_CAPSULE | Freq: Two times a day (BID) | ORAL | 0 refills | Status: DC

## 2023-01-23 NOTE — Telephone Encounter (Signed)
Doxycycline I previously sent was the wrong one, sent the correct one.

## 2023-02-06 ENCOUNTER — Ambulatory Visit: Payer: Medicaid Other | Admitting: Internal Medicine

## 2023-02-11 ENCOUNTER — Other Ambulatory Visit: Payer: Self-pay | Admitting: Internal Medicine

## 2023-02-11 DIAGNOSIS — K219 Gastro-esophageal reflux disease without esophagitis: Secondary | ICD-10-CM

## 2023-02-13 ENCOUNTER — Telehealth: Payer: Self-pay | Admitting: Internal Medicine

## 2023-02-13 NOTE — Telephone Encounter (Signed)
[facility] called back and stated that if they need to refax the medicaid form for patient to be seen to reach back out to office.

## 2023-02-13 NOTE — Telephone Encounter (Signed)
Patient left VM stating that [facility] said that they are waiting on pre-authorization from [place] for her dermatology referral. We have not received anything from them.

## 2023-02-21 ENCOUNTER — Telehealth: Payer: Self-pay | Admitting: Internal Medicine

## 2023-02-21 ENCOUNTER — Ambulatory Visit
Admission: RE | Admit: 2023-02-21 | Discharge: 2023-02-21 | Disposition: A | Discharge status: Home / Self Care | Payer: Medicaid Other | Source: Ambulatory Visit | Attending: Internal Medicine | Admitting: Internal Medicine

## 2023-02-21 DIAGNOSIS — Z1231 Encounter for screening mammogram for malignant neoplasm of breast: Secondary | ICD-10-CM | POA: Insufficient documentation

## 2023-02-21 NOTE — Telephone Encounter (Signed)
Patient left VM with questions about her dermatology referral and asking if she needs to go to [facility].   Called patient back and explained that her PCP on her Medicaid card needs to be updated to [place] before the derm referral will be accepted.  Patient asked regarding the milia - is there anything else that she can do while waiting to get into derm besides the salicylic wash?  Please advise.

## 2023-03-07 ENCOUNTER — Ambulatory Visit: Payer: Medicaid Other | Admitting: Cardiology

## 2023-03-07 ENCOUNTER — Encounter: Payer: Self-pay | Admitting: Cardiology

## 2023-03-07 VITALS — BP 138/72 | HR 80 | Ht 64.0 in | Wt 194.2 lb

## 2023-03-07 DIAGNOSIS — J018 Other acute sinusitis: Secondary | ICD-10-CM | POA: Diagnosis not present

## 2023-03-07 DIAGNOSIS — Z0130 Encounter for examination of blood pressure without abnormal findings: Secondary | ICD-10-CM

## 2023-03-07 DIAGNOSIS — J019 Acute sinusitis, unspecified: Secondary | ICD-10-CM | POA: Insufficient documentation

## 2023-03-07 MED ORDER — AMOXICILLIN-POT CLAVULANATE 875-125 MG PO TABS: 1.0000 | ORAL_TABLET | Freq: Two times a day (BID) | ORAL | 0 refills | Status: AC

## 2023-03-07 MED ORDER — BENZONATATE 100 MG PO CAPS
100.0000 mg | ORAL_CAPSULE | Freq: Three times a day (TID) | ORAL | 1 refills | Status: DC | PRN
Start: 2023-03-07 — End: 2023-11-16

## 2023-03-07 NOTE — Patient Instructions (Signed)
Augmentin Nasal Spray Tessalon pearls Mucinex OTC Antihistamine- Claritin or Zyrtec Rest  Fluids

## 2023-03-07 NOTE — Progress Notes (Signed)
Established Patient Office Visit  Subjective:  Patient ID: Kristin Espinoza, female    DOB: July 22, 1970  Age: 52 y.o. MRN: [id number]  Chief Complaint  Patient presents with   Sinus Problem    Started Sunday afternoon-  Now feeling like its in chest     Patient in office for an acute visit. Patient complaining of PND, sore throat, sinus pressure, chills, congestion, cough and headache. States she has tried ibuprofen over the counter with no relief. Patient gets sinus problems this time of year. Will send in Augmentin and Tessalon pearls. Recommend Nasal Spray, Mucinex, OTC Antihistamine- Claritin or Zyrtec, rest and fluids.   Sinus Problem This is a new problem. The current episode started in the past 7 days. The problem is unchanged. There has been no fever. Associated symptoms include chills, congestion, coughing, headaches, sinus pressure, sneezing and a sore throat. Pertinent negatives include no ear pain or shortness of breath. Treatments tried: ibuprofen. The treatment provided no relief.    No other concerns at this time.   Past Medical History:  Diagnosis Date   Anemia    Heart murmur     History reviewed. No pertinent surgical history.  Social History   Socioeconomic History   Marital status: Single    Spouse name: Not on file   Number of children: Not on file   Years of education: Not on file   Highest education level: Not on file  Occupational History   Not on file  Tobacco Use   Smoking status: Every Day    Current packs/day: 0.50    Types: Cigarettes   Smokeless tobacco: Never  Vaping Use   Vaping status: Some Days  Substance and Sexual Activity   Alcohol use: No    Comment: social   Drug use: Never   Sexual activity: Yes    Birth control/protection: None  Other Topics Concern   Not on file  Social History Narrative   Not on file   Social Determinants of Health   Financial Resource Strain: Not on file  Food Insecurity: Not on file   Transportation Needs: Not on file  Physical Activity: Not on file  Stress: Not on file  Social Connections: Not on file  Intimate Partner Violence: Not on file    Family History  Problem Relation Age of Onset   Breast cancer Neg Hx     No Known Allergies  Review of Systems  Constitutional:  Positive for chills.  HENT:  Positive for congestion, sinus pressure, sinus pain, sneezing and sore throat. Negative for ear pain.   Eyes: Negative.   Respiratory:  Positive for cough and sputum production. Negative for shortness of breath.   Cardiovascular: Negative.  Negative for chest pain.  Gastrointestinal: Negative.  Negative for abdominal pain, constipation and diarrhea.  Genitourinary: Negative.   Musculoskeletal:  Negative for joint pain and myalgias.  Skin: Negative.   Neurological:  Positive for headaches. Negative for dizziness.  Endo/Heme/Allergies: Negative.   All other systems reviewed and are negative.      Objective:   BP 138/72   Pulse 80   Ht 5\' 4"  (1.626 m)   Wt 194 lb 3.2 oz (88.1 kg)   LMP 02/09/2023   SpO2 99%   BMI 33.33 kg/m   Vitals:   03/07/23 1507  BP: 138/72  Pulse: 80  Height: 5\' 4"  (1.626 m)  Weight: 194 lb 3.2 oz (88.1 kg)  SpO2: 99%  BMI (Calculated): 33.32  Physical Exam Vitals and nursing note reviewed.  Constitutional:      Appearance: Normal appearance. She is normal weight.  HENT:     Head: Normocephalic and atraumatic.     Nose: Nose normal.     Mouth/Throat:     Mouth: Mucous membranes are moist.  Eyes:     Extraocular Movements: Extraocular movements intact.     Conjunctiva/sclera: Conjunctivae normal.     Pupils: Pupils are equal, round, and reactive to light.  Cardiovascular:     Rate and Rhythm: Normal rate and regular rhythm.     Pulses: Normal pulses.     Heart sounds: Normal heart sounds.  Pulmonary:     Effort: Pulmonary effort is normal.     Breath sounds: Normal breath sounds.  Abdominal:     General:  Abdomen is flat. Bowel sounds are normal.     Palpations: Abdomen is soft.  Musculoskeletal:        General: Normal range of motion.     Cervical back: Normal range of motion.  Skin:    General: Skin is warm and dry.  Neurological:     General: No focal deficit present.     Mental Status: She is alert and oriented to person, place, and time.  Psychiatric:        Mood and Affect: Mood normal.        Behavior: Behavior normal.        Thought Content: Thought content normal.        Judgment: Judgment normal.      No results found for any visits on 03/07/23.  Recent Results (from the past 2160 hour(s))  CBC with Diff     Status: Abnormal   Collection Time: 01/05/23  3:07 PM  Result Value Ref Range   WBC 6.8 3.4 - 10.8 x10E3/uL   RBC 4.45 3.77 - 5.28 x10E6/uL   Hemoglobin 13.2 11.1 - 15.9 g/dL   Hematocrit 65.7 84.6 - 46.6 %   MCV 90 79 - 97 fL   MCH 29.7 26.6 - 33.0 pg   MCHC 33.1 31.5 - 35.7 g/dL   RDW 96.2 (H) 95.2 - 84.1 %   Platelets 258 150 - 450 x10E3/uL   Neutrophils 56 Not Estab. %   Lymphs 32 Not Estab. %   Monocytes 8 Not Estab. %   Eos 3 Not Estab. %   Basos 1 Not Estab. %   Neutrophils Absolute 3.9 1.4 - 7.0 x10E3/uL   Lymphocytes Absolute 2.2 0.7 - 3.1 x10E3/uL   Monocytes Absolute 0.6 0.1 - 0.9 x10E3/uL   EOS (ABSOLUTE) 0.2 0.0 - 0.4 x10E3/uL   Basophils Absolute 0.1 0.0 - 0.2 x10E3/uL   Immature Granulocytes 0 Not Estab. %   Immature Grans (Abs) 0.0 0.0 - 0.1 x10E3/uL  CMP14+EGFR     Status: Abnormal   Collection Time: 01/05/23  3:07 PM  Result Value Ref Range   Glucose 93 70 - 99 mg/dL   BUN 12 6 - 24 mg/dL   Creatinine, Ser 3.24 0.57 - 1.00 mg/dL   eGFR 76 >40 NU/UVO/5.36   BUN/Creatinine Ratio 13 9 - 23   Sodium 141 134 - 144 mmol/L   Potassium 4.2 3.5 - 5.2 mmol/L   Chloride 107 (H) 96 - 106 mmol/L   CO2 22 20 - 29 mmol/L   Calcium 11.1 (H) 8.7 - 10.2 mg/dL   Total Protein 7.1 6.0 - 8.5 g/dL   Albumin 4.3 3.8 - 4.9 g/dL  Globulin, Total 2.8  1.5 - 4.5 g/dL   Bilirubin Total <6.9 0.0 - 1.2 mg/dL   Alkaline Phosphatase 86 44 - 121 IU/L   AST 20 0 - 40 IU/L   ALT 20 0 - 32 IU/L  Iron and IBC ([id number])     Status: None   Collection Time: 01/05/23  3:07 PM  Result Value Ref Range   Total Iron Binding Capacity 387 250 - 450 ug/dL   UIBC 102 725 - 366 ug/dL   Iron 73 27 - 440 ug/dL   Iron Saturation 19 15 - 55 %  Ferritin     Status: None   Collection Time: 01/05/23  3:07 PM  Result Value Ref Range   Ferritin 18 15 - 150 ng/mL  TSH     Status: None   Collection Time: 01/05/23  3:07 PM  Result Value Ref Range   TSH 1.560 0.450 - 4.500 uIU/mL  Vitamin D (25 hydroxy)     Status: Abnormal   Collection Time: 01/05/23  3:07 PM  Result Value Ref Range   Vit D, 25-Hydroxy 23.0 (L) 30.0 - 100.0 ng/mL    Comment: Vitamin D deficiency has been defined by the Institute of Medicine and an Endocrine Society practice guideline as a level of serum 25-OH vitamin D less than 20 ng/mL (1,2). The Endocrine Society went on to further define vitamin D insufficiency as a level between 21 and 29 ng/mL (2). 1. IOM (Institute of Medicine). 2010. Dietary reference    intakes for calcium and D. [place]: The    [employer]. 2. Holick MF, Binkley [place], Bischoff-Ferrari HA, et al.    Evaluation, treatment, and prevention of vitamin D    deficiency: an Endocrine Society clinical practice    guideline. JCEM. 2011 Jul; 96(7):1911-30.   Vitamin B12     Status: None   Collection Time: 01/05/23  3:07 PM  Result Value Ref Range   Vitamin B-12 450 232 - 1,245 pg/mL  POCT XPERT XPRESS SARS COVID-2/FLU/RSV     Status: None   Collection Time: 01/09/23  2:16 PM  Result Value Ref Range   SARS Coronavirus 2 neg    FLU A neg    FLU B neg    RSV RNA, PCR neg       Assessment & Plan:  Augmentin Tessalon pearls Nasal spray Mucinex OTC antihistamine Rest and fluids  Problem List Items Addressed This Visit       Respiratory    Acute infection of nasal sinus - Primary   Relevant Medications   amoxicillin-clavulanate (AUGMENTIN) 875-125 MG tablet   benzonatate (TESSALON PERLES) 100 MG capsule    Return if symptoms worsen or fail to improve.   Total time spent: 25 minutes  [employer], NP  03/07/2023   This document may have been prepared by Dragon Voice Recognition software and as such may include unintentional dictation errors.

## 2023-03-20 ENCOUNTER — Encounter: Payer: Self-pay | Admitting: Family

## 2023-03-20 ENCOUNTER — Ambulatory Visit: Payer: Medicaid Other | Admitting: Family

## 2023-03-20 VITALS — BP 122/84 | HR 90 | Ht 64.0 in | Wt 192.6 lb

## 2023-03-20 DIAGNOSIS — J018 Other acute sinusitis: Secondary | ICD-10-CM

## 2023-03-20 DIAGNOSIS — F32A Depression, unspecified: Secondary | ICD-10-CM

## 2023-03-20 DIAGNOSIS — Z6833 Body mass index (BMI) 33.0-33.9, adult: Secondary | ICD-10-CM

## 2023-03-20 DIAGNOSIS — E66811 Obesity, class 1: Secondary | ICD-10-CM | POA: Diagnosis not present

## 2023-03-20 DIAGNOSIS — F419 Anxiety disorder, unspecified: Secondary | ICD-10-CM | POA: Diagnosis not present

## 2023-03-20 DIAGNOSIS — E6609 Other obesity due to excess calories: Secondary | ICD-10-CM

## 2023-03-20 MED ORDER — VORTIOXETINE HBR 10 MG PO TABS: 10.0000 mg | ORAL_TABLET | Freq: Every day | ORAL | 3 refills | Status: DC

## 2023-03-20 MED ORDER — LEVOFLOXACIN 750 MG PO TABS: 750.0000 mg | ORAL_TABLET | Freq: Every day | ORAL | 0 refills | Status: DC

## 2023-03-21 ENCOUNTER — Encounter: Payer: Self-pay | Admitting: Family

## 2023-03-21 DIAGNOSIS — F419 Anxiety disorder, unspecified: Secondary | ICD-10-CM | POA: Insufficient documentation

## 2023-03-21 DIAGNOSIS — E6609 Other obesity due to excess calories: Secondary | ICD-10-CM | POA: Insufficient documentation

## 2023-03-21 NOTE — Assessment & Plan Note (Addendum)
Starting patient on Wegovy today.  Pt educated on administration, dosing and frequency, side effects, possible adverse reactions, and mitigation strategies.  She verbalizes understanding and opts to continue with medications.   Samples given for first month.    We will have her follow up in 1 month to discuss and make sure she is doing well with this.

## 2023-03-21 NOTE — Assessment & Plan Note (Signed)
Refilling Trintellix. Also suggested that pt take the bupropion,

## 2023-03-21 NOTE — Progress Notes (Signed)
Established Patient Office Visit  Subjective:  Patient ID: Kristin Espinoza, female    DOB: February 24, 1971  Age: 52 y.o. MRN: [id number]  Chief Complaint  Patient presents with   Follow-up    Discuss weight loss meds    Patient is here today to discuss weight loss options.  She also says that she still has a little bit of the symptoms she was seen for last week.   She also needs a refill on her trintellix, as it did not get transferred with her other meds when she switched pharmacies.   No other concerns at this time.   Past Medical History:  Diagnosis Date   Anemia    Heart murmur     History reviewed. No pertinent surgical history.  Social History   Socioeconomic History   Marital status: Single    Spouse name: Not on file   Number of children: Not on file   Years of education: Not on file   Highest education level: Not on file  Occupational History   Not on file  Tobacco Use   Smoking status: Every Day    Current packs/day: 0.50    Types: Cigarettes   Smokeless tobacco: Never  Vaping Use   Vaping status: Some Days  Substance and Sexual Activity   Alcohol use: No    Comment: social   Drug use: Never   Sexual activity: Yes    Birth control/protection: None  Other Topics Concern   Not on file  Social History Narrative   Not on file   Social Determinants of Health   Financial Resource Strain: Not on file  Food Insecurity: Not on file  Transportation Needs: Not on file  Physical Activity: Not on file  Stress: Not on file  Social Connections: Not on file  Intimate Partner Violence: Not on file    Family History  Problem Relation Age of Onset   Breast cancer Neg Hx     No Known Allergies  Review of Systems  HENT:  Positive for congestion and sinus pain.   All other systems reviewed and are negative.      Objective:   BP 122/84   Pulse 90   Ht 5\' 4"  (1.626 m)   Wt 192 lb 9.6 oz (87.4 kg)   LMP 02/09/2023   SpO2 98%   BMI 33.06 kg/m    Vitals:   03/20/23 1328  BP: 122/84  Pulse: 90  Height: 5\' 4"  (1.626 m)  Weight: 192 lb 9.6 oz (87.4 kg)  SpO2: 98%  BMI (Calculated): 33.04    Physical Exam Vitals and nursing note reviewed.  Constitutional:      Appearance: Normal appearance. She is obese.  HENT:     Head: Normocephalic.     Nose: Congestion and rhinorrhea present.  Eyes:     Extraocular Movements: Extraocular movements intact.     Conjunctiva/sclera: Conjunctivae normal.     Pupils: Pupils are equal, round, and reactive to light.  Cardiovascular:     Rate and Rhythm: Normal rate.  Pulmonary:     Effort: Pulmonary effort is normal.  Neurological:     General: No focal deficit present.     Mental Status: She is alert and oriented to person, place, and time. Mental status is at baseline.  Psychiatric:        Attention and Perception: Attention and perception normal.        Mood and Affect: Mood and affect normal.  Speech: Speech normal.        Behavior: Behavior normal.        Thought Content: Thought content normal.        Judgment: Judgment normal.      No results found for any visits on 03/20/23.  Recent Results (from the past 2160 hour(s))  CBC with Diff     Status: Abnormal   Collection Time: 01/05/23  3:07 PM  Result Value Ref Range   WBC 6.8 3.4 - 10.8 x10E3/uL   RBC 4.45 3.77 - 5.28 x10E6/uL   Hemoglobin 13.2 11.1 - 15.9 g/dL   Hematocrit 40.9 81.1 - 46.6 %   MCV 90 79 - 97 fL   MCH 29.7 26.6 - 33.0 pg   MCHC 33.1 31.5 - 35.7 g/dL   RDW 91.4 (H) 78.2 - 95.6 %   Platelets 258 150 - 450 x10E3/uL   Neutrophils 56 Not Estab. %   Lymphs 32 Not Estab. %   Monocytes 8 Not Estab. %   Eos 3 Not Estab. %   Basos 1 Not Estab. %   Neutrophils Absolute 3.9 1.4 - 7.0 x10E3/uL   Lymphocytes Absolute 2.2 0.7 - 3.1 x10E3/uL   Monocytes Absolute 0.6 0.1 - 0.9 x10E3/uL   EOS (ABSOLUTE) 0.2 0.0 - 0.4 x10E3/uL   Basophils Absolute 0.1 0.0 - 0.2 x10E3/uL   Immature Granulocytes 0 Not Estab.  %   Immature Grans (Abs) 0.0 0.0 - 0.1 x10E3/uL  CMP14+EGFR     Status: Abnormal   Collection Time: 01/05/23  3:07 PM  Result Value Ref Range   Glucose 93 70 - 99 mg/dL   BUN 12 6 - 24 mg/dL   Creatinine, Ser 2.13 0.57 - 1.00 mg/dL   eGFR 76 >08 MV/HQI/6.96   BUN/Creatinine Ratio 13 9 - 23   Sodium 141 134 - 144 mmol/L   Potassium 4.2 3.5 - 5.2 mmol/L   Chloride 107 (H) 96 - 106 mmol/L   CO2 22 20 - 29 mmol/L   Calcium 11.1 (H) 8.7 - 10.2 mg/dL   Total Protein 7.1 6.0 - 8.5 g/dL   Albumin 4.3 3.8 - 4.9 g/dL   Globulin, Total 2.8 1.5 - 4.5 g/dL   Bilirubin Total <2.9 0.0 - 1.2 mg/dL   Alkaline Phosphatase 86 44 - 121 IU/L   AST 20 0 - 40 IU/L   ALT 20 0 - 32 IU/L  Iron and IBC ([id number])     Status: None   Collection Time: 01/05/23  3:07 PM  Result Value Ref Range   Total Iron Binding Capacity 387 250 - 450 ug/dL   UIBC 725 366 - 440 ug/dL   Iron 73 27 - 347 ug/dL   Iron Saturation 19 15 - 55 %  Ferritin     Status: None   Collection Time: 01/05/23  3:07 PM  Result Value Ref Range   Ferritin 18 15 - 150 ng/mL  TSH     Status: None   Collection Time: 01/05/23  3:07 PM  Result Value Ref Range   TSH 1.560 0.450 - 4.500 uIU/mL  Vitamin D (25 hydroxy)     Status: Abnormal   Collection Time: 01/05/23  3:07 PM  Result Value Ref Range   Vit D, 25-Hydroxy 23.0 (L) 30.0 - 100.0 ng/mL    Comment: Vitamin D deficiency has been defined by the Institute of Medicine and an Endocrine Society practice guideline as a level of serum 25-OH vitamin D less than 20 ng/mL (1,2).  The Endocrine Society went on to further define vitamin D insufficiency as a level between 21 and 29 ng/mL (2). 1. IOM (Institute of Medicine). 2010. Dietary reference    intakes for calcium and D. [place]: The    [employer]. 2. Holick MF, Binkley [place], Bischoff-Ferrari HA, et al.    Evaluation, treatment, and prevention of vitamin D    deficiency: an Endocrine Society clinical practice     guideline. JCEM. 2011 Jul; 96(7):1911-30.   Vitamin B12     Status: None   Collection Time: 01/05/23  3:07 PM  Result Value Ref Range   Vitamin B-12 450 232 - 1,245 pg/mL  POCT XPERT XPRESS SARS COVID-2/FLU/RSV     Status: None   Collection Time: 01/09/23  2:16 PM  Result Value Ref Range   SARS Coronavirus 2 neg    FLU A neg    FLU B neg    RSV RNA, PCR neg        Assessment & Plan:   Problem List Items Addressed This Visit       Active Problems   Class 1 obesity due to excess calories with serious comorbidity and body mass index (BMI) of 33.0 to 33.9 in adult - Primary    Starting patient on Wegovy today.  Pt educated on administration, dosing and frequency, side effects, possible adverse reactions, and mitigation strategies.  She verbalizes understanding and opts to continue with medications.   Samples given for first month.    We will have her follow up in 1 month to discuss and make sure she is doing well with this.        Anxiety and depression    Refilling Trintellix. Also suggested that pt take the bupropion,       Relevant Medications   vortioxetine HBr (TRINTELLIX) 10 MG TABS tablet   Other Visit Diagnoses     Acute non-recurrent sinusitis of other sinus       sending a different antibiotic for pt.  She will let me know if this works or not.   Relevant Medications   levofloxacin (LEVAQUIN) 750 MG tablet       Return in about 1 month (around 04/19/2023).   Total time spent: 20 minutes  Miki Kins, FNP  03/20/2023   This document may have been prepared by [facility] Voice Recognition software and as such may include unintentional dictation errors.

## 2023-03-22 ENCOUNTER — Telehealth: Payer: Self-pay | Admitting: Family

## 2023-03-22 NOTE — Telephone Encounter (Signed)
Patient called in wanting to know if Kristin Espinoza ever figured out what kind of topical she should be on for her milia? Please advise.

## 2023-03-25 ENCOUNTER — Other Ambulatory Visit: Payer: Self-pay | Admitting: Internal Medicine

## 2023-03-25 DIAGNOSIS — F17200 Nicotine dependence, unspecified, uncomplicated: Secondary | ICD-10-CM

## 2023-04-19 ENCOUNTER — Ambulatory Visit: Payer: Medicaid Other | Admitting: Family

## 2023-04-21 ENCOUNTER — Other Ambulatory Visit: Payer: Self-pay

## 2023-04-21 DIAGNOSIS — B001 Herpesviral vesicular dermatitis: Secondary | ICD-10-CM

## 2023-04-21 MED ORDER — VALACYCLOVIR HCL 500 MG PO TABS: 500.0000 mg | ORAL_TABLET | ORAL | 11 refills | Status: DC

## 2023-05-04 ENCOUNTER — Ambulatory Visit
Admission: RE | Admit: 2023-05-04 | Discharge: 2023-05-04 | Disposition: A | Discharge status: Home / Self Care | Payer: Medicaid Other | Attending: Family | Admitting: Family

## 2023-05-04 ENCOUNTER — Ambulatory Visit
Admission: RE | Admit: 2023-05-04 | Discharge: 2023-05-04 | Disposition: A | Discharge status: Home / Self Care | Payer: Medicaid Other | Source: Ambulatory Visit | Attending: Family | Admitting: Family

## 2023-05-04 ENCOUNTER — Ambulatory Visit (INDEPENDENT_AMBULATORY_CARE_PROVIDER_SITE_OTHER): Payer: Medicaid Other | Admitting: Family

## 2023-05-04 ENCOUNTER — Encounter: Payer: Self-pay | Admitting: Family

## 2023-05-04 VITALS — BP 128/87 | HR 83 | Ht 64.0 in | Wt 188.2 lb

## 2023-05-04 DIAGNOSIS — R0600 Dyspnea, unspecified: Secondary | ICD-10-CM | POA: Diagnosis not present

## 2023-05-04 DIAGNOSIS — R051 Acute cough: Secondary | ICD-10-CM

## 2023-05-04 DIAGNOSIS — L720 Epidermal cyst: Secondary | ICD-10-CM | POA: Diagnosis not present

## 2023-05-04 DIAGNOSIS — Z0130 Encounter for examination of blood pressure without abnormal findings: Secondary | ICD-10-CM

## 2023-05-04 MED ORDER — AZELEX 20 % EX CREA: TOPICAL_CREAM | Freq: Two times a day (BID) | CUTANEOUS | 0 refills | Status: DC

## 2023-05-05 MED ORDER — AMOXICILLIN-POT CLAVULANATE 875-125 MG PO TABS: 1.0000 | ORAL_TABLET | Freq: Two times a day (BID) | ORAL | 0 refills | Status: DC

## 2023-05-08 ENCOUNTER — Telehealth: Payer: Self-pay

## 2023-05-16 ENCOUNTER — Telehealth: Payer: Self-pay | Admitting: Family

## 2023-05-16 ENCOUNTER — Other Ambulatory Visit: Payer: Self-pay | Admitting: Internal Medicine

## 2023-05-16 DIAGNOSIS — K219 Gastro-esophageal reflux disease without esophagitis: Secondary | ICD-10-CM

## 2023-05-16 NOTE — Telephone Encounter (Signed)
Patient wants to know if there is an alternative to Trintellix since her insurance denied the PA?   Please advise.

## 2023-05-19 NOTE — Telephone Encounter (Signed)
 Patient's Azalex cream PA was denied. Is there something else that we can send in?

## 2023-05-19 NOTE — Progress Notes (Signed)
 Established Patient Office Visit  Subjective:  Patient ID: Kristin Espinoza, female    DOB: 1970/08/13  Age: 53 y.o. MRN: [id number]  Chief Complaint  Patient presents with   Follow-up    Seen at [facility] last week and was told she had bronchitis- started on prednisone and has finished  She feels like she may still have     Patient is here today for her 1 month follow up.  She has been feeling poorly since last appointment.   She does have additional concerns to discuss today.  [facility], they told her she had bronchitis.  She still has symptoms despite finishing the meds they sent her.   Labs are not due today. She needs refills.   I have reviewed her active problem list, medication list, allergies, notes from last encounter, lab results for her appointment today.      No other concerns at this time.   Past Medical History:  Diagnosis Date   Anemia    Heart murmur     History reviewed. No pertinent surgical history.  Social History   Socioeconomic History   Marital status: Single    Spouse name: Not on file   Number of children: Not on file   Years of education: Not on file   Highest education level: Not on file  Occupational History   Not on file  Tobacco Use   Smoking status: Every Day    Current packs/day: 0.50    Types: Cigarettes   Smokeless tobacco: Never  Vaping Use   Vaping status: Some Days  Substance and Sexual Activity   Alcohol use: No    Comment: social   Drug use: Never   Sexual activity: Yes    Birth control/protection: None  Other Topics Concern   Not on file  Social History Narrative   Not on file   Social Drivers of Health   Financial Resource Strain: Not on file  Food Insecurity: Not on file  Transportation Needs: Not on file  Physical Activity: Not on file  Stress: Not on file  Social Connections: Not on file  Intimate Partner Violence: Not on file    Family History  Problem Relation Age of Onset    Breast cancer Neg Hx     No Known Allergies  Review of Systems  Respiratory:  Positive for cough, shortness of breath and wheezing.   Skin:  Positive for rash.  All other systems reviewed and are negative.      Objective:   BP 128/87   Pulse 83   Ht 5\' 4"  (1.626 m)   Wt 188 lb 3.2 oz (85.4 kg)   LMP 03/02/2023   SpO2 97%   BMI 32.30 kg/m   Vitals:   05/04/23 1135  BP: 128/87  Pulse: 83  Height: 5\' 4"  (1.626 m)  Weight: 188 lb 3.2 oz (85.4 kg)  SpO2: 97%  BMI (Calculated): 32.29    Physical Exam Vitals and nursing note reviewed.  Constitutional:      Appearance: Normal appearance. She is normal weight.  HENT:     Head: Normocephalic.  Eyes:     Extraocular Movements: Extraocular movements intact.     Conjunctiva/sclera: Conjunctivae normal.     Pupils: Pupils are equal, round, and reactive to light.  Cardiovascular:     Rate and Rhythm: Normal rate.  Pulmonary:     Effort: Pulmonary effort is normal.     Breath sounds: Wheezing  present.  Neurological:     General: No focal deficit present.     Mental Status: She is alert and oriented to person, place, and time. Mental status is at baseline.  Psychiatric:        Mood and Affect: Mood normal.        Behavior: Behavior normal.        Thought Content: Thought content normal.        Judgment: Judgment normal.      No results found for any visits on 05/04/23.  No results found for this or any previous visit (from the past 2160 hours).     Assessment & Plan:   Problem List Items Addressed This Visit   None Visit Diagnoses       Dyspnea, unspecified type    -  Primary   Getting CXR today.  Will call in abx for pt.  She will notify me if not improving.   Relevant Orders   DG Chest 2 View (Completed)     Acute cough       Getting CXR today.  Will call in abx for pt.  She will notify me if not improving.     Milia       sending RX for azelaic acid to pharmacy for pt. to try.  She will let me  know if this does not work.       Return in about 3 months (around 08/02/2023) for F/U.   Total time spent: 20 minutes  Miki Kins, FNP  05/04/2023   This document may have been prepared by [facility] Voice Recognition software and as such may include unintentional dictation errors.

## 2023-05-31 ENCOUNTER — Other Ambulatory Visit: Payer: Self-pay | Admitting: Medical Genetics

## 2023-05-31 NOTE — Telephone Encounter (Signed)
 Cancel.

## 2023-06-01 ENCOUNTER — Other Ambulatory Visit: Payer: Self-pay | Attending: Medical Genetics

## 2023-06-13 ENCOUNTER — Other Ambulatory Visit: Payer: Self-pay | Admitting: Family

## 2023-06-13 MED ORDER — WEGOVY 0.5 MG/0.5ML ~~LOC~~ SOAJ: 0.5000 mg | SUBCUTANEOUS | 0 refills | Status: DC

## 2023-07-12 ENCOUNTER — Other Ambulatory Visit: Payer: Self-pay | Admitting: Internal Medicine

## 2023-07-12 ENCOUNTER — Telehealth: Payer: Self-pay

## 2023-07-12 DIAGNOSIS — K219 Gastro-esophageal reflux disease without esophagitis: Secondary | ICD-10-CM

## 2023-07-12 NOTE — Telephone Encounter (Signed)
 Pt called and is requesting something for BV- due to the wash you recommended her using she feels it has caused BV, she said she feels like she knows this is what it is.

## 2023-07-28 ENCOUNTER — Other Ambulatory Visit: Payer: Self-pay

## 2023-07-28 MED ORDER — METRONIDAZOLE 500 MG PO TABS: 500.0000 mg | ORAL_TABLET | Freq: Two times a day (BID) | ORAL | 0 refills | Status: DC

## 2023-08-29 ENCOUNTER — Encounter: Payer: Self-pay | Admitting: Certified Nurse Midwife

## 2023-08-29 ENCOUNTER — Other Ambulatory Visit (HOSPITAL_COMMUNITY)
Admission: RE | Admit: 2023-08-29 | Discharge: 2023-08-29 | Disposition: A | Discharge status: Home / Self Care | Source: Ambulatory Visit | Attending: Certified Nurse Midwife | Admitting: Certified Nurse Midwife

## 2023-08-29 ENCOUNTER — Ambulatory Visit (INDEPENDENT_AMBULATORY_CARE_PROVIDER_SITE_OTHER): Admitting: Certified Nurse Midwife

## 2023-08-29 VITALS — BP 110/78 | HR 97 | Ht 64.0 in | Wt 186.2 lb

## 2023-08-29 DIAGNOSIS — Z1151 Encounter for screening for human papillomavirus (HPV): Secondary | ICD-10-CM | POA: Insufficient documentation

## 2023-08-29 DIAGNOSIS — N3946 Mixed incontinence: Secondary | ICD-10-CM

## 2023-08-29 DIAGNOSIS — Z131 Encounter for screening for diabetes mellitus: Secondary | ICD-10-CM

## 2023-08-29 DIAGNOSIS — Z124 Encounter for screening for malignant neoplasm of cervix: Secondary | ICD-10-CM | POA: Insufficient documentation

## 2023-08-29 DIAGNOSIS — Z13220 Encounter for screening for lipoid disorders: Secondary | ICD-10-CM

## 2023-08-29 DIAGNOSIS — Z01419 Encounter for gynecological examination (general) (routine) without abnormal findings: Secondary | ICD-10-CM | POA: Diagnosis not present

## 2023-08-29 DIAGNOSIS — Z1329 Encounter for screening for other suspected endocrine disorder: Secondary | ICD-10-CM

## 2023-08-29 MED ORDER — ESTRADIOL 0.1 MG/GM VA CREA: TOPICAL_CREAM | VAGINAL | 12 refills | Status: DC

## 2023-08-29 NOTE — Progress Notes (Unsigned)
   ANNUAL EXAM Patient name: Kristin Espinoza MRN [id number]  Date of birth: 12/02/1970 Chief Complaint:   Gynecologic Exam  History of Present Illness:   Kristin Espinoza is a 53 y.o. G58P1001 {race:25618} female being seen today for a routine annual exam.  Current complaints: ***  Patient's last menstrual period was 02/09/2023 (exact date).   The pregnancy intention screening data noted above was reviewed. Potential methods of contraception were discussed. The patient elected to proceed with No data recorded.   No results found for: "DIAGPAP", "HPVHIGH", "ADEQPAP"     Last pap ***. Results were: {Pap findings:25134}. H/O abnormal pap: {yes/yes***/no:23866} Last mammogram: ***. Results were: {normal, abnormal, n/a:23837}. Family h/o breast cancer: {yes***/no:23838} Last colonoscopy: ***. Results were: {normal, abnormal, n/a:23837}. Family h/o colorectal cancer: {yes***/no:23838}      No data to display                No data to display            Review of Systems:   Pertinent items are noted in HPI Denies any headaches, blurred vision, fatigue, shortness of breath, chest pain, abdominal pain, abnormal vaginal discharge/itching/odor/irritation, problems with periods, bowel movements, urination, or intercourse unless otherwise stated above. Pertinent History Reviewed:  Reviewed past medical,surgical, social and family history.  Reviewed problem list, medications and allergies. Physical Assessment:   Vitals:   08/29/23 1331  BP: 110/78  Pulse: 97  Weight: 186 lb 3.2 oz (84.5 kg)  Height: 5\' 4"  (1.626 m)  Body mass index is 31.96 kg/m.       Physical Exam   No results found for this or any previous visit (from the past 24 hours).  Assessment & Plan:  There are no diagnoses linked to this encounter.  Mammogram: {Mammo f/u:25212}, or sooner if problems Colonoscopy: {TCS f/u:25213}, or sooner if problems  No orders of the defined types were placed in  this encounter.   Meds: No orders of the defined types were placed in this encounter.   Follow-up: Return in 1 year (on 08/28/2024) for Annual exam.  Forestine Igo, CNM 08/29/2023 1:46 PM

## 2023-08-30 DIAGNOSIS — N3946 Mixed incontinence: Secondary | ICD-10-CM | POA: Diagnosis not present

## 2023-08-30 LAB — CBC
Hematocrit: 39.8 % (ref 34.0–46.6)
Hemoglobin: 12.6 g/dL (ref 11.1–15.9)
MCH: 28.6 pg (ref 26.6–33.0)
MCHC: 31.7 g/dL (ref 31.5–35.7)
MCV: 90 fL (ref 79–97)
Platelets: 251 10*3/uL (ref 150–450)
RBC: 4.41 x10E6/uL (ref 3.77–5.28)
RDW: 17.1 % — ABNORMAL HIGH (ref 11.7–15.4)
WBC: 7.9 10*3/uL (ref 3.4–10.8)

## 2023-08-30 LAB — COMPREHENSIVE METABOLIC PANEL WITH GFR
ALT: 11 IU/L (ref 0–32)
AST: 14 IU/L (ref 0–40)
Albumin: 4.2 g/dL (ref 3.8–4.9)
Alkaline Phosphatase: 91 IU/L (ref 44–121)
BUN/Creatinine Ratio: 9 (ref 9–23)
BUN: 7 mg/dL (ref 6–24)
Bilirubin Total: 0.3 mg/dL (ref 0.0–1.2)
CO2: 19 mmol/L — ABNORMAL LOW (ref 20–29)
Calcium: 10.6 mg/dL — ABNORMAL HIGH (ref 8.7–10.2)
Chloride: 108 mmol/L — ABNORMAL HIGH (ref 96–106)
Creatinine, Ser: 0.8 mg/dL (ref 0.57–1.00)
Globulin, Total: 2.6 g/dL (ref 1.5–4.5)
Glucose: 104 mg/dL — ABNORMAL HIGH (ref 70–99)
Potassium: 4.1 mmol/L (ref 3.5–5.2)
Sodium: 142 mmol/L (ref 134–144)
Total Protein: 6.8 g/dL (ref 6.0–8.5)
eGFR: 88 mL/min/{1.73_m2} (ref >59)

## 2023-08-30 LAB — VITAMIN D 25 HYDROXY (VIT D DEFICIENCY, FRACTURES): Vit D, 25-Hydroxy: 19.6 ng/mL — ABNORMAL LOW (ref 30.0–100.0)

## 2023-08-30 LAB — LIPID PANEL
Chol/HDL Ratio: 5.5 ratio — ABNORMAL HIGH (ref 0.0–4.4)
Cholesterol, Total: 183 mg/dL (ref 100–199)
HDL: 33 mg/dL — ABNORMAL LOW (ref >39)
LDL Chol Calc (NIH): 122 mg/dL — ABNORMAL HIGH (ref 0–99)
Triglycerides: 153 mg/dL — ABNORMAL HIGH (ref 0–149)
VLDL Cholesterol Cal: 28 mg/dL (ref 5–40)

## 2023-08-30 LAB — URINALYSIS, ROUTINE W REFLEX MICROSCOPIC
Bilirubin, UA: NEGATIVE
Glucose, UA: NEGATIVE
Ketones, UA: NEGATIVE
Leukocytes,UA: NEGATIVE
Nitrite, UA: NEGATIVE
RBC, UA: NEGATIVE
Specific Gravity, UA: 1.022 (ref 1.005–1.030)
Urobilinogen, Ur: 0.2 mg/dL (ref 0.2–1.0)
pH, UA: 6 (ref 5.0–7.5)

## 2023-08-30 LAB — HEMOGLOBIN A1C
Est. average glucose Bld gHb Est-mCnc: 123 mg/dL
Hgb A1c MFr Bld: 5.9 % — ABNORMAL HIGH (ref 4.8–5.6)

## 2023-08-30 LAB — TSH RFX ON ABNORMAL TO FREE T4: TSH: 1.03 u[IU]/mL (ref 0.450–4.500)

## 2023-08-31 ENCOUNTER — Encounter: Payer: Self-pay | Admitting: Certified Nurse Midwife

## 2023-08-31 ENCOUNTER — Other Ambulatory Visit: Payer: Self-pay | Admitting: Certified Nurse Midwife

## 2023-08-31 MED ORDER — VITAMIN D (ERGOCALCIFEROL) 1.25 MG (50000 UNIT) PO CAPS: 50000.0000 [IU] | ORAL_CAPSULE | ORAL | 0 refills | Status: AC

## 2023-09-02 LAB — URINE CULTURE

## 2023-09-03 ENCOUNTER — Encounter: Payer: Self-pay | Admitting: Certified Nurse Midwife

## 2023-09-03 MED ORDER — NITROFURANTOIN MONOHYD MACRO 100 MG PO CAPS
100.0000 mg | ORAL_CAPSULE | Freq: Two times a day (BID) | ORAL | 0 refills | Status: AC
Start: 2023-09-03 — End: 2023-09-10

## 2023-09-06 LAB — CYTOLOGY - PAP
Comment: NEGATIVE
Diagnosis: UNDETERMINED — AB
High risk HPV: NEGATIVE

## 2023-09-07 ENCOUNTER — Encounter: Payer: Self-pay | Admitting: Certified Nurse Midwife

## 2023-09-14 ENCOUNTER — Other Ambulatory Visit: Payer: Self-pay | Admitting: Certified Nurse Midwife

## 2023-09-14 DIAGNOSIS — N951 Menopausal and female climacteric states: Secondary | ICD-10-CM

## 2023-09-14 MED ORDER — ESTRADIOL-NORETHINDRONE ACET 1-0.5 MG PO TABS: 1.0000 | ORAL_TABLET | Freq: Every day | ORAL | 2 refills | Status: DC

## 2023-09-14 NOTE — Progress Notes (Signed)
 Activella ordered for vasomotor symptoms of perimenopause.

## 2023-09-16 ENCOUNTER — Other Ambulatory Visit: Payer: Self-pay | Admitting: Family

## 2023-09-16 DIAGNOSIS — K219 Gastro-esophageal reflux disease without esophagitis: Secondary | ICD-10-CM

## 2023-09-26 ENCOUNTER — Other Ambulatory Visit: Payer: Self-pay | Admitting: Certified Nurse Midwife

## 2023-09-26 DIAGNOSIS — N951 Menopausal and female climacteric states: Secondary | ICD-10-CM

## 2023-09-26 MED ORDER — COMBIPATCH 0.05-0.14 MG/DAY TD PTTW: 1.0000 | MEDICATED_PATCH | TRANSDERMAL | 12 refills | Status: DC

## 2023-09-26 MED ORDER — ESTRADIOL 0.1 MG/GM VA CREA: 0.5000 g | TOPICAL_CREAM | Freq: Every day | VAGINAL | 12 refills | Status: DC

## 2023-09-27 NOTE — Telephone Encounter (Signed)
 Pharmacy advised instructions are accurate. Pharmacist advised rx requires prior authorization. Will await that documentation via fax and process.

## 2023-09-28 ENCOUNTER — Other Ambulatory Visit: Payer: Self-pay | Admitting: Internal Medicine

## 2023-09-28 DIAGNOSIS — F17200 Nicotine dependence, unspecified, uncomplicated: Secondary | ICD-10-CM

## 2023-10-04 ENCOUNTER — Telehealth: Payer: Self-pay

## 2023-10-04 ENCOUNTER — Other Ambulatory Visit: Payer: Self-pay

## 2023-10-04 DIAGNOSIS — N959 Unspecified menopausal and perimenopausal disorder: Secondary | ICD-10-CM

## 2023-10-04 DIAGNOSIS — N3946 Mixed incontinence: Secondary | ICD-10-CM

## 2023-10-04 MED ORDER — PREMARIN 0.625 MG/GM VA CREA
1.0000 | TOPICAL_CREAM | Freq: Every day | VAGINAL | 11 refills | Status: DC
Start: 2023-10-04 — End: 2023-12-18

## 2023-10-04 NOTE — Telephone Encounter (Signed)
 Kristin Espinoza

## 2023-10-09 ENCOUNTER — Other Ambulatory Visit: Payer: Self-pay | Admitting: Certified Nurse Midwife

## 2023-10-18 ENCOUNTER — Other Ambulatory Visit (HOSPITAL_COMMUNITY)
Admission: RE | Admit: 2023-10-18 | Discharge: 2023-10-18 | Disposition: A | Discharge status: Home / Self Care | Source: Ambulatory Visit | Attending: Certified Nurse Midwife | Admitting: Certified Nurse Midwife

## 2023-10-18 ENCOUNTER — Encounter: Payer: Self-pay | Admitting: Certified Nurse Midwife

## 2023-10-18 ENCOUNTER — Ambulatory Visit (INDEPENDENT_AMBULATORY_CARE_PROVIDER_SITE_OTHER): Admitting: Certified Nurse Midwife

## 2023-10-18 VITALS — BP 121/80 | HR 67 | Ht 64.0 in | Wt 192.9 lb

## 2023-10-18 DIAGNOSIS — R238 Other skin changes: Secondary | ICD-10-CM

## 2023-10-18 DIAGNOSIS — N393 Stress incontinence (female) (male): Secondary | ICD-10-CM

## 2023-10-18 DIAGNOSIS — N898 Other specified noninflammatory disorders of vagina: Secondary | ICD-10-CM

## 2023-10-18 NOTE — Progress Notes (Unsigned)
 Trenda Frisk, FNP   Chief Complaint  Patient presents with  . Follow-up    Medication follow up    HPI:      Kristin Espinoza is a 53 y.o. G1P1001 whose LMP was Patient's last menstrual period was 01/15/2023 (approximate)., presents today for ***    Patient Active Problem List   Diagnosis Date Noted  . Class 1 obesity due to excess calories with serious comorbidity and body mass index (BMI) of 33.0 to 33.9 in adult 03/21/2023  . Anxiety and depression 03/21/2023  . Absolute anemia 01/09/2023  . Vaginal discharge 12/07/2017  . Urinary incontinence in female 12/07/2017  . SUI (stress urinary incontinence, female) 10/18/2017  . Bacterial vaginosis 10/18/2017    No past surgical history on file.  Family History  Problem Relation Age of Onset  . Breast cancer Neg Hx     Social History   Socioeconomic History  . Marital status: Single    Spouse name: Not on file  . Number of children: Not on file  . Years of education: Not on file  . Highest education level: Not on file  Occupational History  . Not on file  Tobacco Use  . Smoking status: Every Day    Current packs/day: 0.50    Types: Cigarettes  . Smokeless tobacco: Never  Vaping Use  . Vaping status: Some Days  Substance and Sexual Activity  . Alcohol use: No  . Drug use: Never  . Sexual activity: Not Currently    Birth control/protection: None  Other Topics Concern  . Not on file  Social History Narrative  . Not on file   Social Drivers of Health   Financial Resource Strain: Not on file  Food Insecurity: Not on file  Transportation Needs: Not on file  Physical Activity: Not on file  Stress: Not on file  Social Connections: Not on file  Intimate Partner Violence: Not on file    Outpatient Medications Prior to Visit  Medication Sig Dispense Refill  . albuterol (VENTOLIN HFA) 108 (90 Base) MCG/ACT inhaler Inhale 2 puffs into the lungs every 6 (six) hours as needed for shortness of breath  or wheezing.    . amoxicillin -clavulanate (AUGMENTIN ) 875-125 MG tablet Take 1 tablet by mouth 2 (two) times daily. (Patient not taking: Reported on 10/18/2023) 20 tablet 0  . azelaic acid  (AZELEX ) 20 % cream Apply topically 2 (two) times daily. After skin is thoroughly washed and patted dry, gently but thoroughly massage a thin film of azelaic acid  cream into the affected area twice daily, in the morning and evening. (Patient not taking: Reported on 08/29/2023) 30 g 0  . benzonatate  (TESSALON  PERLES) 100 MG capsule Take 1 capsule (100 mg total) by mouth 3 (three) times daily as needed for cough. (Patient not taking: Reported on 08/29/2023) 30 capsule 1  . buPROPion  (WELLBUTRIN  XL) 150 MG 24 hr tablet TAKE 1 TABLET(150 MG) BY MOUTH EVERY MORNING 30 tablet 2  . busPIRone (BUSPAR) 10 MG tablet TK 1 T PO BID  5  . conjugated estrogens  (PREMARIN ) vaginal cream Place 1 Applicatorful vaginally daily. Apply a pea sized amount to the vaginal introitus nightly for one month, then every other night for one month, then twice weekly. 30 g 11  . estradiol -norethindrone  (COMBIPATCH ) 0.05-0.14 MG/DAY Place 1 patch onto the skin 2 (two) times a week. 8 patch 12  . metroNIDAZOLE  (FLAGYL ) 500 MG tablet Take 1 tablet (500 mg total) by mouth 2 (two) times  daily. (Patient not taking: Reported on 08/29/2023) 20 tablet 0  . mometasone  (ELOCON ) 0.1 % cream Apply 1 application. topically daily as needed (Rash). Avoid applying to face, groin, and axilla. Use as directed. Long-term use can cause thinning of the skin. (Patient not taking: Reported on 08/29/2023) 45 g 1  . mupirocin  ointment (BACTROBAN ) 2 % Apply 1 application topically daily. Qd to excision site (Patient not taking: Reported on 08/29/2023) 22 g 1  . pantoprazole  (PROTONIX ) 40 MG tablet TAKE 1 TABLET(40 MG) BY MOUTH DAILY 30 tablet 1  . Semaglutide -Weight Management (WEGOVY ) 0.5 MG/0.5ML SOAJ Inject 0.5 mg into the skin once a week. (Patient not taking: Reported on  08/29/2023) 2 mL 0  . terconazole  (TERAZOL 7 ) 0.4 % vaginal cream Place 1 applicator vaginally at bedtime. (Patient not taking: Reported on 08/29/2023) 45 g 0  . valACYclovir  (VALTREX ) 500 MG tablet Take 1 tablet (500 mg total) by mouth as directed. 1 po bid for 7 days to start at onset of each episode of fever blister 28 tablet 11  . vortioxetine  HBr (TRINTELLIX ) 10 MG TABS tablet Take 1 tablet (10 mg total) by mouth daily. (Patient not taking: Reported on 08/29/2023) 30 tablet 3   No facility-administered medications prior to visit.      ROS:  Review of Systems   OBJECTIVE:   Vitals:  BP 121/80   Pulse 67   Ht 5\' 4"  (1.626 m)   Wt 192 lb 14.4 oz (87.5 kg)   LMP 01/15/2023 (Approximate)   BMI 33.11 kg/m   Physical Exam  Results: No results found for this or any previous visit (from the past 24 hours).   Assessment/Plan: No diagnosis found.    No orders of the defined types were placed in this encounter.    Forestine Igo, CNM 10/18/2023 4:09 PM

## 2023-10-20 ENCOUNTER — Encounter: Payer: Self-pay | Admitting: Certified Nurse Midwife

## 2023-10-20 LAB — CERVICOVAGINAL ANCILLARY ONLY
Bacterial Vaginitis (gardnerella): NEGATIVE
Candida Glabrata: POSITIVE — AB
Candida Vaginitis: NEGATIVE
Comment: NEGATIVE
Comment: NEGATIVE
Comment: NEGATIVE

## 2023-10-21 ENCOUNTER — Ambulatory Visit: Payer: Self-pay | Admitting: Certified Nurse Midwife

## 2023-10-23 ENCOUNTER — Other Ambulatory Visit: Payer: Self-pay | Admitting: Certified Nurse Midwife

## 2023-10-23 LAB — URINE CULTURE

## 2023-10-23 MED ORDER — AMOXICILLIN-POT CLAVULANATE 875-125 MG PO TABS: 1.0000 | ORAL_TABLET | Freq: Two times a day (BID) | ORAL | 0 refills | Status: AC

## 2023-10-26 ENCOUNTER — Other Ambulatory Visit: Payer: Self-pay

## 2023-10-26 DIAGNOSIS — N898 Other specified noninflammatory disorders of vagina: Secondary | ICD-10-CM

## 2023-10-26 MED ORDER — FLUCONAZOLE 150 MG PO TABS: 150.0000 mg | ORAL_TABLET | Freq: Once | ORAL | 1 refills | Status: AC

## 2023-10-29 ENCOUNTER — Other Ambulatory Visit: Payer: Self-pay | Admitting: Family

## 2023-10-29 DIAGNOSIS — K219 Gastro-esophageal reflux disease without esophagitis: Secondary | ICD-10-CM

## 2023-11-13 ENCOUNTER — Encounter: Payer: Self-pay | Admitting: Certified Nurse Midwife

## 2023-11-16 ENCOUNTER — Ambulatory Visit: Payer: Self-pay | Admitting: Primary Care

## 2023-11-16 ENCOUNTER — Encounter: Payer: Self-pay | Admitting: General Practice

## 2023-11-16 ENCOUNTER — Ambulatory Visit (INDEPENDENT_AMBULATORY_CARE_PROVIDER_SITE_OTHER): Admitting: General Practice

## 2023-11-16 VITALS — BP 124/86 | HR 84 | Temp 98.0°F | Ht 63.85 in | Wt 189.0 lb

## 2023-11-16 DIAGNOSIS — E559 Vitamin D deficiency, unspecified: Secondary | ICD-10-CM

## 2023-11-16 DIAGNOSIS — Z6832 Body mass index (BMI) 32.0-32.9, adult: Secondary | ICD-10-CM

## 2023-11-16 DIAGNOSIS — R7303 Prediabetes: Secondary | ICD-10-CM | POA: Diagnosis not present

## 2023-11-16 DIAGNOSIS — E6609 Other obesity due to excess calories: Secondary | ICD-10-CM

## 2023-11-16 DIAGNOSIS — E66811 Obesity, class 1: Secondary | ICD-10-CM

## 2023-11-16 DIAGNOSIS — Z7689 Persons encountering health services in other specified circumstances: Secondary | ICD-10-CM

## 2023-11-16 DIAGNOSIS — R600 Localized edema: Secondary | ICD-10-CM

## 2023-11-16 DIAGNOSIS — E782 Mixed hyperlipidemia: Secondary | ICD-10-CM

## 2023-11-16 DIAGNOSIS — Z716 Tobacco abuse counseling: Secondary | ICD-10-CM

## 2023-11-16 LAB — VITAMIN D 25 HYDROXY (VIT D DEFICIENCY, FRACTURES): VITD: 17.89 ng/mL — ABNORMAL LOW (ref 30.00–100.00)

## 2023-11-16 LAB — BRAIN NATRIURETIC PEPTIDE: Pro B Natriuretic peptide (BNP): 4 pg/mL (ref 0.0–100.0)

## 2023-11-16 MED ORDER — VITAMIN D (ERGOCALCIFEROL) 1.25 MG (50000 UNIT) PO CAPS: ORAL_CAPSULE | ORAL | 0 refills | Status: DC

## 2023-11-16 NOTE — Assessment & Plan Note (Signed)
 EMR reviewed briefly.

## 2023-11-16 NOTE — Progress Notes (Addendum)
 New Patient Office Visit  Subjective    Patient ID: Kristin Espinoza, female    DOB: 07-May-1971  Age: 53 y.o. MRN: [id number]  CC:  Chief Complaint  Patient presents with   New Patient (Initial Visit)   Edema    Swelling in both ankles x couple months. Patient states she is going thru beginning stages of menopause.    Obesity    Wants to discuss options; insurance wont pay for wegovy  but would like to maybe try something else if possible. Patient states she does not eat much and keeps gaining weight.     HPI Kristin Espinoza is a 53 y.o. female presents to establish care.   Last PCP/physical/labs: Dr. Fernand and Alan at [facility]. Physical was about a month ago with OBGYN.   Edema: located in both ankles. Right one worse. Onset two months ago. There is no any pain and itching. She was told that it was related to perimenopause. Swelling worse as the day goes on. Weekends the swelling is better as she is not on her feet as much.   Obesity: weight gain started about three years ago. She use to weight around 140s-160lbs. She does work two jobs and is a smoker. Diet currently consists of:  Breakfast: cereal (chex, honey nut cheerios, frosted flakes and fruit loops) with milk, apple sauce.  Lunch: pasta,  Dinner: pizza, salmon, chicken curry Snacks:chips Desserts: not much Beverages: sodas (pepsi 2-3 20 oz daily), coffee with cream or sugar.   Prediabetes: hemoglobin A1c 5.9 in April. Has tried to monitor her diet.    Outpatient Encounter Medications as of 11/16/2023  Medication Sig   albuterol (VENTOLIN HFA) 108 (90 Base) MCG/ACT inhaler Inhale 2 puffs into the lungs every 6 (six) hours as needed for shortness of breath or wheezing.   buPROPion  (WELLBUTRIN  XL) 150 MG 24 hr tablet TAKE 1 TABLET(150 MG) BY MOUTH EVERY MORNING   conjugated estrogens  (PREMARIN ) vaginal cream Place 1 Applicatorful vaginally daily. Apply a pea sized amount to the vaginal introitus nightly  for one month, then every other night for one month, then twice weekly.   estradiol -norethindrone  (COMBIPATCH ) 0.05-0.14 MG/DAY Place 1 patch onto the skin 2 (two) times a week.   pantoprazole  (PROTONIX ) 40 MG tablet TAKE 1 TABLET(40 MG) BY MOUTH DAILY   [DISCONTINUED] azelaic acid  (AZELEX ) 20 % cream Apply topically 2 (two) times daily. After skin is thoroughly washed and patted dry, gently but thoroughly massage a thin film of azelaic acid  cream into the affected area twice daily, in the morning and evening. (Patient not taking: Reported on 08/29/2023)   [DISCONTINUED] benzonatate  (TESSALON  PERLES) 100 MG capsule Take 1 capsule (100 mg total) by mouth 3 (three) times daily as needed for cough. (Patient not taking: Reported on 08/29/2023)   [DISCONTINUED] busPIRone (BUSPAR) 10 MG tablet TK 1 T PO BID   [DISCONTINUED] metroNIDAZOLE  (FLAGYL ) 500 MG tablet Take 1 tablet (500 mg total) by mouth 2 (two) times daily. (Patient not taking: Reported on 08/29/2023)   [DISCONTINUED] mometasone  (ELOCON ) 0.1 % cream Apply 1 application. topically daily as needed (Rash). Avoid applying to face, groin, and axilla. Use as directed. Long-term use can cause thinning of the skin. (Patient not taking: Reported on 08/29/2023)   [DISCONTINUED] mupirocin  ointment (BACTROBAN ) 2 % Apply 1 application topically daily. Qd to excision site (Patient not taking: Reported on 08/29/2023)   [DISCONTINUED] Semaglutide -Weight Management (WEGOVY ) 0.5 MG/0.5ML SOAJ Inject 0.5 mg into the skin once a week. (  Patient not taking: Reported on 08/29/2023)   [DISCONTINUED] terconazole  (TERAZOL 7 ) 0.4 % vaginal cream Place 1 applicator vaginally at bedtime. (Patient not taking: Reported on 08/29/2023)   [DISCONTINUED] valACYclovir  (VALTREX ) 500 MG tablet Take 1 tablet (500 mg total) by mouth as directed. 1 po bid for 7 days to start at onset of each episode of fever blister   [DISCONTINUED] vortioxetine  HBr (TRINTELLIX ) 10 MG TABS tablet Take 1 tablet  (10 mg total) by mouth daily. (Patient not taking: Reported on 08/29/2023)   No facility-administered encounter medications on file as of 11/16/2023.    Past Medical History:  Diagnosis Date   Anemia    Heart murmur     History reviewed. No pertinent surgical history.  Family History  Problem Relation Age of Onset   Breast cancer Neg Hx     Social History   Socioeconomic History   Marital status: Single    Spouse name: Not on file   Number of children: Not on file   Years of education: Not on file   Highest education level: Not on file  Occupational History   Not on file  Tobacco Use   Smoking status: Every Day    Current packs/day: 0.50    Types: Cigarettes   Smokeless tobacco: Never  Vaping Use   Vaping status: Some Days  Substance and Sexual Activity   Alcohol use: No   Drug use: Never   Sexual activity: Not Currently    Birth control/protection: None  Other Topics Concern   Not on file  Social History Narrative   Not on file   Social Drivers of Health   Financial Resource Strain: Not on file  Food Insecurity: Not on file  Transportation Needs: Not on file  Physical Activity: Not on file  Stress: Not on file  Social Connections: Not on file  Intimate Partner Violence: Not on file    Review of Systems  Constitutional:  Negative for chills and fever.  Respiratory:  Negative for shortness of breath.   Cardiovascular:  Negative for chest pain.  Gastrointestinal:  Negative for abdominal pain, constipation, diarrhea, heartburn, nausea and vomiting.  Genitourinary:  Negative for dysuria, frequency and urgency.  Neurological:  Negative for dizziness and headaches.  Endo/Heme/Allergies:  Negative for polydipsia.  Psychiatric/Behavioral:  Negative for depression and suicidal ideas. The patient is not nervous/anxious.         Objective    BP 124/86   Pulse 84   Temp 98 F (36.7 C) (Oral)   Ht 5' 3.85 (1.622 m)   Wt 189 lb (85.7 kg)   LMP 01/15/2023  (Approximate)   SpO2 98%   BMI 32.59 kg/m   Physical Exam Vitals and nursing note reviewed.  Constitutional:      Appearance: Normal appearance.  Cardiovascular:     Rate and Rhythm: Normal rate and regular rhythm.     Pulses: Normal pulses.     Heart sounds: Normal heart sounds.  Pulmonary:     Effort: Pulmonary effort is normal.     Breath sounds: Normal breath sounds.  Neurological:     Mental Status: She is alert and oriented to person, place, and time.  Psychiatric:        Mood and Affect: Mood normal.        Behavior: Behavior normal.        Thought Content: Thought content normal.        Judgment: Judgment normal.  Assessment & Plan:  Peripheral edema Assessment & Plan: Unclear etiology.  No red flags on exam.  BNP pending.   Start using compression stockings/socks and elevate legs.  Discussed decreasing sodium intake.  F/u in 3 months or sooner if needed.  Orders: -     Brain natriuretic peptide  Establishing care with new doctor, encounter for Assessment & Plan: EMR reviewed briefly.    Prediabetes Assessment & Plan: Hemoglobin A1c 5.9  Uncontrolled.  Discussed the importance of monitoring diet and eating foods with low glycemic index.  Encouraged exercise.   Handout provided. Referral placed for healthy weight and wellness.  Orders: -     Amb Ref to Medical Weight Management  Mixed hyperlipidemia Assessment & Plan: Uncontrolled.   Reviewed labs with patient from April.  Discussed the importance of eating a low fat low cholesterol diet.  Encouraged exercise.  Handout provided.  Will repeat lipid panel at next visit.   F/u in 3 months.  Orders: -     Amb Ref to Medical Weight Management  Class 1 obesity due to excess calories with serious comorbidity and body mass index (BMI) of 32.0 to 32.9 in adult Assessment & Plan: Uncontrolled.  Long discussion about weight loss, diet, and exercise Recommended diet heavy in fruits  and veggies and low in animal meats, cheeses, and dairy products, appropriate calorie intake Patient will work on decreasing saturated fats and simple carbs  Increase lean proteins and activity  Referral placed for healthy weight and wellness clinic.   F/u in 3 months.  Orders: -     Amb Ref to Medical Weight Management  Vitamin D  deficiency Assessment & Plan: Repeat vitamin d  level pending.  Orders: -     VITAMIN D  25 Hydroxy (Vit-D Deficiency, Fractures)  Tobacco abuse counseling Assessment & Plan: Smoking cessation instruction/counseling given:  counseled patient on the dangers of tobacco use, advised patient to stop smoking, and reviewed strategies to maximize success       Return in about 3 months (around 02/16/2024) for obesity, cholesterol.   Carrol Aurora, NP

## 2023-11-16 NOTE — Assessment & Plan Note (Signed)
 Uncontrolled.  Long discussion about weight loss, diet, and exercise Recommended diet heavy in fruits and veggies and low in animal meats, cheeses, and dairy products, appropriate calorie intake Patient will work on decreasing saturated fats and simple carbs  Increase lean proteins and activity  Referral placed for healthy weight and wellness clinic.   F/u in 3 months.

## 2023-11-16 NOTE — Assessment & Plan Note (Addendum)
 Hemoglobin A1c 5.9  Uncontrolled.  Discussed the importance of monitoring diet and eating foods with low glycemic index.  Encouraged exercise.   Handout provided. Referral placed for healthy weight and wellness.

## 2023-11-16 NOTE — Addendum Note (Signed)
 Addended by: VINCENTE SHIVERS on: 11/16/2023 11:00 AM   Modules accepted: Orders

## 2023-11-16 NOTE — Assessment & Plan Note (Signed)
 Repeat vitamin d level pending.

## 2023-11-16 NOTE — Patient Instructions (Signed)
 Start wearing compression stockings/socks. Elevate your legs.   You will either be contacted via phone regarding your referral to healthy weight and wellness , or you may receive a letter on your MyChart portal from our referral team with instructions for scheduling an appointment. Please let us  know if you have not been contacted by anyone within two weeks.  Follow up in 3 months.   It was a pleasure to see you today!

## 2023-11-16 NOTE — Assessment & Plan Note (Signed)
 Unclear etiology.  No red flags on exam.  BNP pending.   Start using compression stockings/socks and elevate legs.  Discussed decreasing sodium intake.  F/u in 3 months or sooner if needed.

## 2023-11-16 NOTE — Assessment & Plan Note (Signed)
 Smoking cessation instruction/counseling given:  counseled patient on the dangers of tobacco use, advised patient to stop smoking, and reviewed strategies to maximize success

## 2023-11-16 NOTE — Assessment & Plan Note (Signed)
 Uncontrolled.   Reviewed labs with patient from April.  Discussed the importance of eating a low fat low cholesterol diet.  Encouraged exercise.  Handout provided.  Will repeat lipid panel at next visit.   F/u in 3 months.

## 2023-11-21 ENCOUNTER — Encounter: Payer: Self-pay | Admitting: General Practice

## 2023-11-21 ENCOUNTER — Encounter: Payer: Self-pay | Admitting: Certified Nurse Midwife

## 2023-11-22 ENCOUNTER — Encounter: Payer: Self-pay | Admitting: Certified Nurse Midwife

## 2023-11-22 MED ORDER — NYSTATIN 100000 UNIT/GM EX POWD
1.0000 | Freq: Every day | CUTANEOUS | 0 refills | Status: AC
Start: 2023-11-22 — End: 2023-12-06

## 2023-11-22 MED ORDER — NYSTATIN 100000 UNIT/GM EX POWD
1.0000 | Freq: Every day | CUTANEOUS | 0 refills | Status: DC
Start: 2023-11-22 — End: 2023-11-22

## 2023-11-27 ENCOUNTER — Other Ambulatory Visit: Payer: Self-pay

## 2023-11-29 DIAGNOSIS — L299 Pruritus, unspecified: Secondary | ICD-10-CM | POA: Diagnosis not present

## 2023-11-29 DIAGNOSIS — I83893 Varicose veins of bilateral lower extremities with other complications: Secondary | ICD-10-CM | POA: Diagnosis not present

## 2023-11-29 DIAGNOSIS — R600 Localized edema: Secondary | ICD-10-CM | POA: Diagnosis not present

## 2023-11-29 DIAGNOSIS — I872 Venous insufficiency (chronic) (peripheral): Secondary | ICD-10-CM | POA: Diagnosis not present

## 2023-11-30 DIAGNOSIS — L309 Dermatitis, unspecified: Secondary | ICD-10-CM | POA: Diagnosis not present

## 2023-12-18 ENCOUNTER — Ambulatory Visit (INDEPENDENT_AMBULATORY_CARE_PROVIDER_SITE_OTHER): Admitting: Obstetrics

## 2023-12-18 ENCOUNTER — Encounter: Payer: Self-pay | Admitting: Obstetrics

## 2023-12-18 ENCOUNTER — Other Ambulatory Visit (HOSPITAL_COMMUNITY)
Admission: RE | Admit: 2023-12-18 | Discharge: 2023-12-18 | Disposition: A | Discharge status: Home / Self Care | Source: Ambulatory Visit | Attending: Obstetrics | Admitting: Obstetrics

## 2023-12-18 VITALS — BP 123/87 | HR 64 | Ht 65.35 in | Wt 188.4 lb

## 2023-12-18 DIAGNOSIS — R351 Nocturia: Secondary | ICD-10-CM

## 2023-12-18 DIAGNOSIS — R350 Frequency of micturition: Secondary | ICD-10-CM

## 2023-12-18 DIAGNOSIS — N393 Stress incontinence (female) (male): Secondary | ICD-10-CM | POA: Diagnosis not present

## 2023-12-18 DIAGNOSIS — N898 Other specified noninflammatory disorders of vagina: Secondary | ICD-10-CM | POA: Insufficient documentation

## 2023-12-18 DIAGNOSIS — N952 Postmenopausal atrophic vaginitis: Secondary | ICD-10-CM | POA: Insufficient documentation

## 2023-12-18 DIAGNOSIS — Z716 Tobacco abuse counseling: Secondary | ICD-10-CM

## 2023-12-18 LAB — POCT URINALYSIS DIP (CLINITEK)
Bilirubin, UA: NEGATIVE
Blood, UA: NEGATIVE
Glucose, UA: NEGATIVE mg/dL
Ketones, POC UA: NEGATIVE mg/dL
Leukocytes, UA: NEGATIVE
Nitrite, UA: NEGATIVE
POC PROTEIN,UA: NEGATIVE
Spec Grav, UA: 1.025 (ref 1.010–1.025)
Urobilinogen, UA: 0.2 U/dL
pH, UA: 6 (ref 5.0–8.0)

## 2023-12-18 MED ORDER — GEMTESA 75 MG PO TABS: 75.0000 mg | ORAL_TABLET | Freq: Every day | ORAL | Status: DC

## 2023-12-18 MED ORDER — GEMTESA 75 MG PO TABS: 75.0000 mg | ORAL_TABLET | Freq: Every day | ORAL | 2 refills | Status: AC

## 2023-12-18 MED ORDER — ESTRADIOL 0.1 MG/GM VA CREA: TOPICAL_CREAM | VAGINAL | 3 refills | Status: DC

## 2023-12-18 NOTE — Assessment & Plan Note (Addendum)
-   POCT UA + protein  - For treatment of stress urinary incontinence,  non-surgical options include expectant management, weight loss, physical therapy, as well as a pessary.  Surgical options include a midurethral sling, Burch urethropexy, and transurethral injection of a bulking agent. - encouraged tobacco cessation - encouraged Kegel exercises, fluid management, caffeine reduction and weight reduction with pending nutritionist appt - pt desires to return for pessary fitting - continue vaginal estrogen use  - discussed risk of change in urinary or bowel symptoms, vaginal ulceration, discharge, bleeding, fistula formation. Explained that pt may require multiple sizes and types for fitting.
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
## 2023-12-18 NOTE — Assessment & Plan Note (Signed)
-   avoid fluid intake 3 hours before bedtime - elevated feet during the day or use compression socks to reduce lower extremity swelling - due to snoring, consider sleep study for sleep apnea

## 2023-12-18 NOTE — Assessment & Plan Note (Addendum)
-   We discussed the symptoms of overactive bladder (OAB), which include urinary urgency, urinary frequency, nocturia, with or without urge incontinence.  While we do not know the exact etiology of OAB, several treatment options exist. We discussed management including behavioral therapy (decreasing bladder irritants, urge suppression strategies, timed voids, bladder retraining), physical therapy, medication; for refractory cases posterior tibial nerve stimulation, sacral neuromodulation, and intravesical botulinum toxin injection.  For anticholinergic medications, we discussed the potential side effects of anticholinergics including dry eyes, dry mouth, constipation, cognitive impairment and urinary retention. For Beta-3 agonist medication, we discussed the potential side effect of elevated blood pressure which is more likely to occur in individuals with uncontrolled hypertension. - samples and Rx for Gemtesa . Patient to call if cost prohibitive - encouraged fluid management and caffeine reduction - continue low dose vaginal estrogen

## 2023-12-18 NOTE — Assessment & Plan Note (Addendum)
-   For symptomatic vaginal atrophy options include lubrication with a water-based lubricant, personal hygiene measures and barrier protection against wetness, and estrogen replacement in the form of vaginal cream, vaginal tablets, or a time-released vaginal ring.   - reduce low dose vaginal estrogen to maintenance dose 1g twice a week - Rx changed to [facility] due to cost. Instructions reviewed for patient to signup

## 2023-12-18 NOTE — Patient Instructions (Addendum)
 For treatment of stress urinary incontinence, which is leakage with physical activity/movement/strainging/coughing, we discussed expectant management versus nonsurgical options versus surgery. Nonsurgical options include weight loss, physical therapy, as well as a pessary.  Surgical options include a midurethral sling, which is a synthetic mesh sling that acts like a hammock under the urethra to prevent leakage of urine, a Burch urethropexy, and transurethral injection of a bulking agent.   We discussed the symptoms of overactive bladder (OAB), which include urinary urgency, urinary frequency, night-time urination, with or without urge incontinence.  We discussed management including behavioral therapy (decreasing bladder irritants by following a bladder diet, urge suppression strategies, timed voids, bladder retraining), physical therapy, medication; and for refractory cases posterior tibial nerve stimulation, sacral neuromodulation, and intravesical botulinum toxin injection.   For Beta-3 agonist medication, we discussed the potential side effect of elevated blood pressure which is more likely to occur in individuals with uncontrolled hypertension. You were given samples for Gemtesa  75 mg.  It can take a month to start working so give it time, but if you have bothersome side effects call sooner and we can try a different medication.  Call us  if you have trouble filling the prescription or if it's not covered by your insurance.  For night time frequency: - avoid fluid intake 3 hours before bedtime - elevated your feet during the day or use compression socks to reduce lower extremity swelling - if you snore, consider sleep study to rule out sleep apnea  We recommend tobacco cessation due to increased risk of surgical site infection, heart/lung complications, and risk of delayed wound healing.  Please visit the website below to sign up for an account. We will need an email address to send along with  your prescription to verify your prescription once you have signed up.  [email or web address]  Estradiol  Vaginal Estrogen Cream Cream  0.01%  1 count  $13.21

## 2023-12-18 NOTE — Assessment & Plan Note (Signed)
-   h/o candida glabrata s/p Boric acid - reports increased vaginal irritation since 05/2023, relief with vaginal estrogen - reduce low dose vaginal estrogen to 1g twice a week - pending Nuswab to r/o infectious etiology

## 2023-12-18 NOTE — Assessment & Plan Note (Addendum)
-   encouraged tobacco cessation due to association with stress urinary incontinence - We recommend tobacco cessation to reduce surgical site infection, heart/lung complications, and risk of delayed wound healing - continue Wellbutrin

## 2023-12-19 ENCOUNTER — Encounter: Payer: Self-pay | Admitting: Obstetrics

## 2023-12-19 ENCOUNTER — Ambulatory Visit: Payer: Self-pay | Admitting: Obstetrics

## 2023-12-19 LAB — CERVICOVAGINAL ANCILLARY ONLY
Bacterial Vaginitis (gardnerella): NEGATIVE
Candida Glabrata: NEGATIVE
Candida Vaginitis: NEGATIVE
Comment: NEGATIVE
Comment: NEGATIVE
Comment: NEGATIVE

## 2023-12-21 NOTE — Progress Notes (Signed)
 Medical Nutrition Therapy  Appointment Start time:  1030  Appointment End time: 1132  Primary concerns today: weight management and what to eat for borderline diabetes  Referral diagnosis: prediabetes,mixed hyperlipidemia, class 1 obesity with serious comorbidity Preferred learning style: no preference indicated Learning readiness: change in progress   NUTRITION ASSESSMENT   Clinical Medical Hx:  Past Medical History:  Diagnosis Date   Anemia    Fainting spell    Heart murmur    History of urinary tract infection    Hyperlipidemia     Medications:  Current Outpatient Medications:    buPROPion  (WELLBUTRIN  XL) 150 MG 24 hr tablet, TAKE 1 TABLET(150 MG) BY MOUTH EVERY MORNING, Disp: 30 tablet, Rfl: 2   estradiol  (ESTRACE ) 0.1 MG/GM vaginal cream, Place 0.5-1g twice a week after, Disp: 30 g, Rfl: 3   pantoprazole  (PROTONIX ) 40 MG tablet, TAKE 1 TABLET(40 MG) BY MOUTH DAILY, Disp: 30 tablet, Rfl: 1   Vitamin D , Ergocalciferol , (DRISDOL ) 1.25 MG (50000 UNIT) CAPS capsule, Take 1 capsule by mouth once weekly for 12 weeks., Disp: 12 capsule, Rfl: 0   albuterol (VENTOLIN HFA) 108 (90 Base) MCG/ACT inhaler, Inhale 2 puffs into the lungs every 6 (six) hours as needed for shortness of breath or wheezing., Disp: , Rfl:    Vibegron  (GEMTESA ) 75 MG TABS, Take 1 tablet (75 mg total) by mouth daily., Disp: , Rfl:    Vibegron  (GEMTESA ) 75 MG TABS, Take 1 tablet (75 mg total) by mouth daily., Disp: 30 tablet, Rfl: 2  Labs:  Lab Results  Component Value Date   HGBA1C 5.9 (H) 08/29/2023   Lab Results  Component Value Date   CHOL 183 08/29/2023   HDL 33 (L) 08/29/2023   LDLCALC 122 (H) 08/29/2023   TRIG 153 (H) 08/29/2023   CHOLHDL 5.5 (H) 08/29/2023   Last vitamin D  Lab Results  Component Value Date   VD25OH 17.89 (L) 11/16/2023   Notable Signs/Symptoms:  Wt Readings from Last 3 Encounters:  12/18/23 188 lb 6.4 oz (85.5 kg)  11/16/23 189 lb (85.7 kg)  10/18/23 192 lb 14.4 oz (87.5  kg)   Lifestyle & Dietary Hx Pt present today alone and desires to prevent further progression to diabetes and promote weight loss to a lower BMI range. Pt reports making the following changes including water and eating out less. Pt reports she has decreased intake of regular soda from 60 ounces down to 20 ounces daily. 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 Pt reports would like to know if smoothie king is a healthy option. All Pt's questions were answered during this encounter.   Estimated daily fluid intake: 64 oz Supplements: none Sleep: 3 hour nightly on average Stress / self-care: 8-9 out of 10 / self care includes: prayer, music, read Current average weekly physical activity: ADL's  24-Hr Dietary Recall First Meal: eggs, [place] sausage, orange juice,  coffee with whole milk, 3 tablespoons of sugar.  Snack: none Second Meal: ~ 1 pm: island green smoothie, acia bowl with nutella, water Snack: sun chips, water  Third Meal: skips or chicken, mashed potatoes, green beans, pepsi  Snack: none Beverages: water, pepsi, coffee with whole milk, 3 tablespoons of sugar, orange juice  NUTRITION DIAGNOSIS  NB-1.1 Food and nutrition-related knowledge deficit As related to no prior nutrition related education.  As evidenced by Pt reports and dietary recall.  NUTRITION INTERVENTION  Nutrition education (E-1) on the following topics:  Fruits & Vegetables: Aim to fill half your plate with a variety of fruits and vegetables. They are rich in vitamins, minerals, and fiber, and can help reduce the risk of chronic diseases. Choose a colorful assortment of fruits and vegetables to ensure you get a wide range of nutrients. Grains and Starches: Make at least half of your grain choices whole grains, such as brown rice, whole wheat bread, and oats. Whole grains provide fiber, which aids in digestion and healthy  cholesterol levels. Aim for whole forms of starchy vegetables such as potatoes, sweet potatoes, beans, peas, and corn, which are fiber rich and provide many vitamins and minerals.  Protein: Incorporate lean sources of protein, such as poultry, fish, beans, nuts, and seeds, into your meals. Protein is essential for building and repairing tissues, staying full, balancing blood sugar, as well as supporting immune function. Dairy: Include low-fat or fat-free dairy products like milk, yogurt, and cheese in your diet. Dairy foods are excellent sources of calcium and vitamin D , which are crucial for bone health.  Physical Activity: Aim for 60 minutes of physical activity daily. Regular physical activity promotes overall health-including helping to reduce risk for heart disease and diabetes, promoting mental health, and helping us  sleep better.    Handouts Provided Include  Plate Planner- [employer] Prediabetes- Novo Building a Healthy plate- Lipid.org Move Your  Way-[facility]  Learning Style & Readiness for Change Teaching method utilized: Visual & Auditory  Demonstrated degree of understanding via: Teach Back  Barriers to learning/adherence to lifestyle change: time management  Goals Established by Pt Increase non starchy vegetables on half a 9 inch plate 1/4 protein, 1/4 carbohydrate, 1/2 non starchy vegetables Increase physical activity aiming 2 days for 10-15 minutes as tolerated   MONITORING & EVALUATION Dietary intake, weekly physical activity  Next Steps  Patient is to return.

## 2023-12-25 ENCOUNTER — Encounter: Attending: General Practice | Admitting: Dietician

## 2023-12-25 DIAGNOSIS — E782 Mixed hyperlipidemia: Secondary | ICD-10-CM | POA: Insufficient documentation

## 2023-12-25 DIAGNOSIS — Z6832 Body mass index (BMI) 32.0-32.9, adult: Secondary | ICD-10-CM | POA: Insufficient documentation

## 2023-12-25 DIAGNOSIS — R7303 Prediabetes: Secondary | ICD-10-CM | POA: Insufficient documentation

## 2023-12-25 DIAGNOSIS — E66811 Obesity, class 1: Secondary | ICD-10-CM | POA: Insufficient documentation

## 2023-12-25 DIAGNOSIS — E6609 Other obesity due to excess calories: Secondary | ICD-10-CM

## 2023-12-25 DIAGNOSIS — Z713 Dietary counseling and surveillance: Secondary | ICD-10-CM | POA: Diagnosis not present

## 2023-12-25 NOTE — Patient Instructions (Signed)
 Increase non starchy vegetables on half a 9 inch plate 1/4 protein, 1/4 carbohydrate, 1/2 non starchy vegetables  Increase physical activity aiming 2 days for 10-15 minutes as tolerated

## 2024-01-01 ENCOUNTER — Telehealth: Payer: Self-pay

## 2024-01-01 NOTE — Telephone Encounter (Signed)
 Called pt and schedule appt

## 2024-01-01 NOTE — Telephone Encounter (Signed)
 Looks like 2 Nvs were scheduled. She needs an OV with Bableen done as we dont know what all she needs on this form. Please schedule and OV and cancel the NVs

## 2024-01-01 NOTE — Telephone Encounter (Signed)
 Patient will need appt to have form done. Please have patient schedule appt

## 2024-01-01 NOTE — Telephone Encounter (Signed)
 Copied from CRM #[id number]. Topic: Appointments - Appointment Scheduling >> Jan 01, 2024 12:55 PM Kristin Espinoza wrote: Patient/patient representative is calling to schedule an appointment. Refer to attachments for appointment information. Patient is calling to schedule a TB skin test for employer. 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 She will also need Carrol Aurora to fill out some paper work the school is request.

## 2024-01-01 NOTE — Telephone Encounter (Signed)
lvm for pt to call office to reschedule appt

## 2024-01-02 ENCOUNTER — Ambulatory Visit

## 2024-01-03 ENCOUNTER — Ambulatory Visit: Admitting: General Practice

## 2024-01-03 ENCOUNTER — Encounter: Payer: Self-pay | Admitting: General Practice

## 2024-01-03 ENCOUNTER — Ambulatory Visit (INDEPENDENT_AMBULATORY_CARE_PROVIDER_SITE_OTHER): Admitting: General Practice

## 2024-01-03 VITALS — BP 106/80 | HR 79 | Temp 98.0°F | Ht 65.35 in | Wt 189.0 lb

## 2024-01-03 DIAGNOSIS — K219 Gastro-esophageal reflux disease without esophagitis: Secondary | ICD-10-CM

## 2024-01-03 DIAGNOSIS — Z23 Encounter for immunization: Secondary | ICD-10-CM | POA: Diagnosis not present

## 2024-01-03 DIAGNOSIS — Z111 Encounter for screening for respiratory tuberculosis: Secondary | ICD-10-CM | POA: Diagnosis not present

## 2024-01-03 MED ORDER — PANTOPRAZOLE SODIUM 40 MG PO TBEC: 40.0000 mg | DELAYED_RELEASE_TABLET | Freq: Every day | ORAL | 2 refills | Status: AC

## 2024-01-03 NOTE — Addendum Note (Signed)
 Addended by: TENNIE RAISIN B on: 01/03/2024 11:53 AM   Modules accepted: Orders

## 2024-01-03 NOTE — Patient Instructions (Addendum)
 Schedule follow up for October for your three month follow up from your last visit.   Schedule nurse visit for tb skin test reading and bring back the tetanus shot record.  Continue Protonix  40 mg once daily. Refill sent.   It was a pleasure to see you today!

## 2024-01-03 NOTE — Progress Notes (Signed)
 Established Patient Office Visit  Subjective   Patient ID: Kristin Espinoza, female    DOB: Jul 10, 1970  Age: 53 y.o. MRN: [id number]  Chief Complaint  Patient presents with   Form Completion    And TB skin test    HPI Kristin Espinoza is a 53 year old female with past medical history of obesity, prediabetes, HLD, anxiety and depression presents today for form completion and TB skin test for a new job.  Discussed the use of AI scribe software for clinical note transcription with the patient, who gave verbal consent to proceed.  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 She has never had a positive TB skin test and has not undergone a chest x-ray. She believes she received a tetanus shot within the last ten years, possibly last year or the year before, but is unable to locate the documentation. [facility], her previous primary care provider, might have the records.  She is currently taking pantoprazole  for acid reflux, which she tolerates well. She usually receives a 30-day supply and has requested a refill as her current prescription has no remaining refills.  No current symptoms or health problems, including no heart or lung issues. She does not wear glasses and denies current heart or lung problems.   Patient Active Problem List   Diagnosis Date Noted   Nocturia 12/18/2023   Urinary frequency 12/18/2023   Vaginal atrophy 12/18/2023   Prediabetes 11/16/2023   Mixed hyperlipidemia 11/16/2023   Peripheral edema 11/16/2023   Establishing care with new doctor, encounter for 11/16/2023   Vitamin D  deficiency 11/16/2023   Tobacco abuse counseling 11/16/2023   Class 1 obesity due to excess calories with serious comorbidity and body mass index (BMI) of 33.0 to 33.9 in adult 03/21/2023   Anxiety and depression 03/21/2023    Absolute anemia 01/09/2023   Vaginal discharge 12/07/2017   Urinary incontinence in female 12/07/2017   SUI (stress urinary incontinence, female) 10/18/2017   Bacterial vaginosis 10/18/2017   Past Medical History:  Diagnosis Date   Anemia    Fainting spell    Heart murmur    History of urinary tract infection    Hyperlipidemia    History reviewed. No pertinent surgical history. No Known Allergies       01/03/2024   10:27 AM 11/16/2023   11:15 AM 08/31/2023   11:48 AM  Depression screen PHQ 2/9  Decreased Interest 0 0 0  Down, Depressed, Hopeless 0 0 0  PHQ - 2 Score 0 0 0  Altered sleeping 0 0   Tired, decreased energy 1 1   Change in appetite 0 0   Feeling bad or failure about yourself  0 0   Trouble concentrating 0 0   Moving slowly or fidgety/restless 0 0   Suicidal thoughts 0 0   PHQ-9 Score 1 1   Difficult doing work/chores Not difficult at all Not difficult at all        01/03/2024   10:27 AM 11/16/2023   11:16 AM  GAD 7 : Generalized Anxiety Score  Nervous, Anxious, on Edge 0 0  Control/stop worrying 0 0  Worry too much - different things 0 0  Trouble relaxing 0 0  Restless 0 0  Easily annoyed or irritable 0 1  Afraid - awful might happen 0 0  Total GAD 7 Score  0 1  Anxiety Difficulty Not difficult at all Not difficult at all      Review of Systems  Constitutional:  Negative for chills and fever.  Respiratory:  Negative for shortness of breath.   Cardiovascular:  Negative for chest pain.  Gastrointestinal:  Negative for abdominal pain, constipation, diarrhea, heartburn, nausea and vomiting.  Genitourinary:  Negative for dysuria, frequency and urgency.  Neurological:  Negative for dizziness and headaches.  Endo/Heme/Allergies:  Negative for polydipsia.  Psychiatric/Behavioral:  Negative for depression and suicidal ideas. The patient is not nervous/anxious.       Objective:     BP 106/80   Pulse 79   Temp 98 F (36.7 C) (Temporal)   Ht 5' 5.35  (1.66 m)   Wt 189 lb (85.7 kg)   SpO2 97%   BMI 31.12 kg/m  BP Readings from Last 3 Encounters:  01/03/24 106/80  12/18/23 123/87  11/16/23 124/86   Wt Readings from Last 3 Encounters:  01/03/24 189 lb (85.7 kg)  12/18/23 188 lb 6.4 oz (85.5 kg)  11/16/23 189 lb (85.7 kg)      Physical Exam Vitals and nursing note reviewed.  Constitutional:      Appearance: Normal appearance.  Cardiovascular:     Rate and Rhythm: Normal rate and regular rhythm.     Pulses: Normal pulses.     Heart sounds: Normal heart sounds.  Pulmonary:     Effort: Pulmonary effort is normal.     Breath sounds: Normal breath sounds.  Neurological:     Mental Status: She is alert and oriented to person, place, and time.  Psychiatric:        Mood and Affect: Mood normal.        Behavior: Behavior normal.        Thought Content: Thought content normal.        Judgment: Judgment normal.      No results found for any visits on 01/03/24.     The 10-year ASCVD risk score (Arnett DK, et al., 2019) is: 4.7%    Assessment & Plan:  Screening for tuberculosis -     TB Skin Test  Gastroesophageal reflux disease without esophagitis -     Pantoprazole  Sodium; Take 1 tablet (40 mg total) by mouth daily.  Dispense: 30 tablet; Refill: 2    Assessment and Plan Assessment & Plan Occupational Health Clearance for School Employment Requires documentation of negative TB test and up-to-date tetanus vaccination for school employment. No history of positive TB test or chest x-ray. Tetanus vaccination status uncertain, plans to obtain records from previous provider or employer. - Administer TB skin test and schedule nurse visit for reading on Friday. - Obtain tetanus vaccination documentation from previous provider or employer. - Sign occupational health clearance forms once TB test is read and tetanus documentation is provided.  Gastroesophageal reflux disease Tolerating pantoprazole  well. - Prescribe  pantoprazole  40 mg once daily. Refill sent.   Return in about 6 weeks (around 02/16/2024) for 3 month follow up from previous visit in July.SABRA Carrol Aurora, NP

## 2024-01-04 ENCOUNTER — Ambulatory Visit

## 2024-01-05 ENCOUNTER — Ambulatory Visit

## 2024-01-05 DIAGNOSIS — Z111 Encounter for screening for respiratory tuberculosis: Secondary | ICD-10-CM

## 2024-01-05 LAB — TB SKIN TEST
Induration: 0 mm
TB Skin Test: NEGATIVE

## 2024-01-05 NOTE — Progress Notes (Signed)
 PPD Reading Note PPD read and results entered in EpicCare. Result: 0 mm induration. Interpretation: negative If test not read within 48-72 hours of initial placement, patient advised to repeat in other arm 1-3 weeks after this test. Allergic reaction: no

## 2024-01-16 ENCOUNTER — Ambulatory Visit: Admitting: Obstetrics and Gynecology

## 2024-01-19 ENCOUNTER — Other Ambulatory Visit: Payer: Self-pay

## 2024-01-19 DIAGNOSIS — N393 Stress incontinence (female) (male): Secondary | ICD-10-CM

## 2024-01-19 DIAGNOSIS — N952 Postmenopausal atrophic vaginitis: Secondary | ICD-10-CM

## 2024-01-19 MED ORDER — ESTRADIOL 0.1 MG/GM VA CREA: TOPICAL_CREAM | VAGINAL | 3 refills | Status: DC

## 2024-01-22 MED ORDER — ESTRADIOL 0.1 MG/GM VA CREA: TOPICAL_CREAM | VAGINAL | 3 refills | Status: AC

## 2024-01-22 NOTE — Addendum Note (Signed)
 Addended byBETHA GUADLUPE DULL T on: 01/22/2024 03:02 PM   Modules accepted: Orders

## 2024-01-31 NOTE — Progress Notes (Deleted)
 Medical Nutrition Therapy  Appointment Start time:  *** Appointment End time: ***  Primary concerns today: weight management and what to eat for borderline diabetes  Referral diagnosis: prediabetes,mixed hyperlipidemia, class 1 obesity with serious comorbidity Preferred learning style: no preference indicated Learning readiness: change in progress   NUTRITION ASSESSMENT   Clinical Medical Hx:  Past Medical History:  Diagnosis Date   Anemia    Fainting spell    Heart murmur    History of urinary tract infection    Hyperlipidemia     Medications:  Current Outpatient Medications:    albuterol (VENTOLIN HFA) 108 (90 Base) MCG/ACT inhaler, Inhale 2 puffs into the lungs every 6 (six) hours as needed for shortness of breath or wheezing., Disp: , Rfl:    buPROPion  (WELLBUTRIN  XL) 150 MG 24 hr tablet, TAKE 1 TABLET(150 MG) BY MOUTH EVERY MORNING, Disp: 30 tablet, Rfl: 2   estradiol  (ESTRACE ) 0.1 MG/GM vaginal cream, Place 0.5-1g twice a week after, Disp: 42.5 g, Rfl: 3   pantoprazole  (PROTONIX ) 40 MG tablet, Take 1 tablet (40 mg total) by mouth daily., Disp: 30 tablet, Rfl: 2   Vibegron  (GEMTESA ) 75 MG TABS, Take 1 tablet (75 mg total) by mouth daily., Disp: 30 tablet, Rfl: 2   Vitamin D , Ergocalciferol , (DRISDOL ) 1.25 MG (50000 UNIT) CAPS capsule, Take 1 capsule by mouth once weekly for 12 weeks., Disp: 12 capsule, Rfl: 0  Labs:  Lab Results  Component Value Date   HGBA1C 5.9 (H) 08/29/2023   Lab Results  Component Value Date   CHOL 183 08/29/2023   HDL 33 (L) 08/29/2023   LDLCALC 122 (H) 08/29/2023   TRIG 153 (H) 08/29/2023   CHOLHDL 5.5 (H) 08/29/2023   Last vitamin D  Lab Results  Component Value Date   VD25OH 17.89 (L) 11/16/2023   Notable Signs/Symptoms:  Wt Readings from Last 3 Encounters:  01/03/24 189 lb (85.7 kg)  12/18/23 188 lb 6.4 oz (85.5 kg)  11/16/23 189 lb (85.7 kg)   Lifestyle & Dietary Hx   ? Rx Vitamin D    8/11: Pt presents today alone ***. Pt  desires to prevent further progression to overt diabetes and promote weight loss to a lower BMI range.   Pt reports making the following changes including water and eating out less.  Pt reports she has decreased intake of regular soda from 60 ounces down to 20 ounces daily.  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  Pt reports would like to know if smoothie king is a healthy option.  All Pt's questions were answered during this encounter.   Estimated daily fluid intake: 64 oz Supplements: none Sleep: 3 hour nightly on average Stress / self-care: 8-9 out of 10 / self care includes: prayer, music, read Current average weekly physical activity: ADL's  24-Hr Dietary Recall First Meal: eggs, [place] sausage, orange juice,  coffee with whole milk, 3 tablespoons of sugar.  Snack: none Second Meal: ~ 1 pm: island green smoothie, acia bowl with nutella, water Snack: sun chips, water  Third Meal: skips or chicken, mashed potatoes, green beans, pepsi  Snack: none Beverages: water, pepsi, coffee with whole milk, 3 tablespoons of sugar, orange juice  NUTRITION DIAGNOSIS  NB-1.1 Food and nutrition-related knowledge deficit As related to no prior nutrition related education.  As evidenced by Pt reports and dietary recall.  NUTRITION INTERVENTION  Nutrition education (E-1)  on the following topics:  Fruits & Vegetables: Aim to fill half your plate with a variety of fruits and vegetables. They are rich in vitamins, minerals, and fiber, and can help reduce the risk of chronic diseases. Choose a colorful assortment of fruits and vegetables to ensure you get a wide range of nutrients. Grains and Starches: Make at least half of your grain choices whole grains, such as brown rice, whole wheat bread, and oats. Whole grains provide fiber, which aids in digestion and healthy cholesterol levels. Aim for whole forms of starchy  vegetables such as potatoes, sweet potatoes, beans, peas, and corn, which are fiber rich and provide many vitamins and minerals.  Protein: Incorporate lean sources of protein, such as poultry, fish, beans, nuts, and seeds, into your meals. Protein is essential for building and repairing tissues, staying full, balancing blood sugar, as well as supporting immune function. Dairy: Include low-fat or fat-free dairy products like milk, yogurt, and cheese in your diet. Dairy foods are excellent sources of calcium and vitamin D , which are crucial for bone health.  Physical Activity: Aim for 60 minutes of physical activity daily. Regular physical activity promotes overall health-including helping to reduce risk for heart disease and diabetes, promoting mental health, and helping us  sleep better.    Handouts Provided Include  Plate Planner- [employer] Prediabetes- Novo Building a Healthy plate- Lipid.org Move Your  Way-DH[facility]  Learning Style & Readiness for Change Teaching method utilized: Visual & Auditory  Demonstrated degree of understanding via: Teach Back  Barriers to learning/adherence to lifestyle change: time management  Goals Established by Pt Increase non starchy vegetables on half a 9 inch plate 1/4 protein, 1/4 carbohydrate, 1/2 non starchy vegetables Increase physical activity aiming 2 days for 10-15 minutes as tolerated   MONITORING & EVALUATION Dietary intake, weekly physical activity  Next Steps  Patient is to ***

## 2024-02-05 ENCOUNTER — Ambulatory Visit: Admitting: Dietician

## 2024-02-05 DIAGNOSIS — E782 Mixed hyperlipidemia: Secondary | ICD-10-CM

## 2024-02-05 DIAGNOSIS — E6609 Other obesity due to excess calories: Secondary | ICD-10-CM

## 2024-02-05 DIAGNOSIS — R7303 Prediabetes: Secondary | ICD-10-CM

## 2024-02-16 ENCOUNTER — Ambulatory Visit: Admitting: General Practice

## 2024-02-19 ENCOUNTER — Ambulatory Visit (INDEPENDENT_AMBULATORY_CARE_PROVIDER_SITE_OTHER): Admitting: General Practice

## 2024-02-19 ENCOUNTER — Encounter: Payer: Self-pay | Admitting: General Practice

## 2024-02-19 VITALS — BP 124/80 | HR 77 | Temp 97.9°F | Ht 65.35 in | Wt 189.0 lb

## 2024-02-19 DIAGNOSIS — L659 Nonscarring hair loss, unspecified: Secondary | ICD-10-CM

## 2024-02-19 DIAGNOSIS — F419 Anxiety disorder, unspecified: Secondary | ICD-10-CM | POA: Diagnosis not present

## 2024-02-19 DIAGNOSIS — Z23 Encounter for immunization: Secondary | ICD-10-CM

## 2024-02-19 DIAGNOSIS — F32A Depression, unspecified: Secondary | ICD-10-CM

## 2024-02-19 DIAGNOSIS — E559 Vitamin D deficiency, unspecified: Secondary | ICD-10-CM

## 2024-02-19 DIAGNOSIS — E782 Mixed hyperlipidemia: Secondary | ICD-10-CM

## 2024-02-19 DIAGNOSIS — Z1231 Encounter for screening mammogram for malignant neoplasm of breast: Secondary | ICD-10-CM

## 2024-02-19 MED ORDER — SERTRALINE HCL 50 MG PO TABS: 50.0000 mg | ORAL_TABLET | Freq: Every day | ORAL | 0 refills | Status: DC

## 2024-02-19 NOTE — Progress Notes (Signed)
 Established Patient Office Visit  Subjective   Patient ID: Kristin Espinoza, female    DOB: 05-18-1970  Age: 53 y.o. MRN: [id number]  Chief Complaint  Patient presents with   chronic care management    HPI  Kristin Espinoza is a 53 year old female with past medical history of obesity, anxiety and depression, HLD, preidabetes, tobacco abuse presents today for chronic care management.   Discussed the use of AI scribe software for clinical note transcription with the patient, who gave verbal consent to proceed.  History of Present Illness She has experienced weight gain over the past three years and has been attempting to lose weight. She [facility] and has received beneficial information. She has begun walking and is trying to manage her work and personal life stress. Despite these efforts, her weight has remained stable since her last visit.  She reports hair thinning and breakage, which she attributes partly to aging. She is concerned about visible areas where her hair is breaking off. She is awaiting a dermatology appointment in January.   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 She describes herself as having OCD tendencies, needing to complete tasks perfectly and on time, which contributes to her stress. No thoughts of self-harm or harm to others.  Her vitamin D  levels were low in July, and she has been taking prescribed vitamin D  supplements.  She is due for a recheck of her vitamin D  levels today.   She is starting a new job at [employer] and needs a form filled out.     Patient Active Problem List   Diagnosis Date Noted   Nocturia 12/18/2023   Urinary frequency 12/18/2023   Vaginal atrophy 12/18/2023   Prediabetes  11/16/2023   Mixed hyperlipidemia 11/16/2023   Peripheral edema 11/16/2023   Establishing care with new doctor, encounter for 11/16/2023   Vitamin D  deficiency 11/16/2023   Tobacco abuse counseling 11/16/2023   Class 1 obesity due to excess calories with serious comorbidity and body mass index (BMI) of 33.0 to 33.9 in adult 03/21/2023   Anxiety and depression 03/21/2023   Absolute anemia 01/09/2023   Vaginal discharge 12/07/2017   Urinary incontinence in female 12/07/2017   SUI (stress urinary incontinence, female) 10/18/2017   Bacterial vaginosis 10/18/2017   Past Medical History:  Diagnosis Date   Anemia    Fainting spell    Heart murmur    History of urinary tract infection    Hyperlipidemia    History reviewed. No pertinent surgical history. No Known Allergies       02/19/2024    4:06 PM 01/03/2024   10:27 AM 11/16/2023   11:15 AM  Depression screen PHQ 2/9  Decreased Interest 0 0 0  Down, Depressed, Hopeless 1 0 0  PHQ - 2 Score 1 0 0  Altered sleeping 0 0 0  Tired, decreased energy 1 1 1   Change in appetite 0 0 0  Feeling bad or failure about yourself  0 0 0  Trouble concentrating 0 0 0  Moving slowly or fidgety/restless 0 0 0  Suicidal thoughts 0 0 0  PHQ-9 Score 2 1 1   Difficult doing work/chores Not difficult at all Not difficult at all Not difficult at all  02/19/2024    4:07 PM 01/03/2024   10:27 AM 11/16/2023   11:16 AM  GAD 7 : Generalized Anxiety Score  Nervous, Anxious, on Edge 0 0 0  Control/stop worrying 0 0 0  Worry too much - different things 1 0 0  Trouble relaxing 0 0 0  Restless 0 0 0  Easily annoyed or irritable 0 0 1  Afraid - awful might happen 0 0 0  Total GAD 7 Score 1 0 1  Anxiety Difficulty Not difficult at all Not difficult at all Not difficult at all      Review of Systems  Constitutional:  Negative for chills and fever.  Respiratory:  Negative for shortness of breath.   Cardiovascular:  Negative for chest pain.   Gastrointestinal:  Negative for abdominal pain, constipation, diarrhea, heartburn, nausea and vomiting.  Genitourinary:  Negative for dysuria, frequency and urgency.  Neurological:  Negative for dizziness and headaches.  Endo/Heme/Allergies:  Negative for polydipsia.  Psychiatric/Behavioral:  Negative for depression and suicidal ideas. The patient is not nervous/anxious.       Objective:     BP 124/80   Pulse 77   Temp 97.9 F (36.6 C) (Oral)   Ht 5' 5.35 (1.66 m)   Wt 189 lb (85.7 kg)   SpO2 99%   BMI 31.12 kg/m  BP Readings from Last 3 Encounters:  02/19/24 124/80  01/03/24 106/80  12/18/23 123/87   Wt Readings from Last 3 Encounters:  02/19/24 189 lb (85.7 kg)  01/03/24 189 lb (85.7 kg)  12/18/23 188 lb 6.4 oz (85.5 kg)      Physical Exam Vitals and nursing note reviewed.  Constitutional:      Appearance: Normal appearance.  Cardiovascular:     Rate and Rhythm: Normal rate and regular rhythm.     Pulses: Normal pulses.     Heart sounds: Normal heart sounds.  Pulmonary:     Effort: Pulmonary effort is normal.     Breath sounds: Normal breath sounds.  Neurological:     Mental Status: She is alert and oriented to person, place, and time.  Psychiatric:        Mood and Affect: Mood normal.        Behavior: Behavior normal.        Thought Content: Thought content normal.        Judgment: Judgment normal.      No results found for any visits on 02/19/24.     The 10-year ASCVD risk score (Arnett DK, et al., 2019) is: 6.7%    Assessment & Plan:  Encounter for screening mammogram for malignant neoplasm of breast -     3D Screening Mammogram, Left and Right; Future  Hair loss  Mixed hyperlipidemia -     Lipid panel  Vitamin D  deficiency -     VITAMIN D  25 Hydroxy (Vit-D Deficiency, Fractures)  Anxiety and depression -     Sertraline HCl; Take 1 tablet (50 mg total) by mouth daily.  Dispense: 30 tablet; Refill: 0    Assessment and  Plan Assessment & Plan Depression and anxiety symptoms Stress and anxiety related to work, life, and school with perfectionism and racing thoughts. - Start sertraline 25 mg once daily at bedtime for one week, then increase to 50 mg. - Schedule follow-up in four weeks to assess response to sertraline. - Provide handout on sertraline. - Discussed therapy options, including virtual appointments.  Hyperlipidemia Cholesterol levels previously borderline, inconsistent diet monitoring. - Check  cholesterol levels today. - Consider medication if cholesterol remains high.  Vitamin D  deficiency Vitamin D  deficiency identified in July, currently taking supplements. - Check vitamin D  levels today.  Obesity Weight well-managed with healthy weight clinic and increased physical activity. - Continue attending the [facility] clinic. - Encourage regular physical activity such as walking. - Discuss weight loss medication options if insurance coverage changes.  Hair loss Hair thinning and breakage likely related to aging. Dermatology appointment scheduled for January. - Start a hair vitamin, such as biotin, at the lowest dose. - Continue interventions consistently for three months. - Follow up with dermatology as planned.    Return in about 4 weeks (around 03/18/2024) for anxiety and depression.    Carrol Aurora, NP

## 2024-02-19 NOTE — Patient Instructions (Signed)
 Stop by the lab prior to leaving today. I will notify you of your results once received.   Start sertraline 25 mg once daily at bedtime for one week and then increase to 50 mg thereafter.   F/u in 4 weeks.  It was a pleasure to see you today!

## 2024-02-20 ENCOUNTER — Ambulatory Visit: Payer: Self-pay | Admitting: General Practice

## 2024-02-20 DIAGNOSIS — E559 Vitamin D deficiency, unspecified: Secondary | ICD-10-CM

## 2024-02-20 LAB — VITAMIN D 25 HYDROXY (VIT D DEFICIENCY, FRACTURES): VITD: 28.49 ng/mL — ABNORMAL LOW (ref 30.00–100.00)

## 2024-02-20 LAB — LIPID PANEL
Cholesterol: 164 mg/dL (ref 0–200)
HDL: 32.2 mg/dL — ABNORMAL LOW (ref >39.00)
LDL Cholesterol: 92 mg/dL (ref 0–99)
NonHDL: 132.27
Total CHOL/HDL Ratio: 5
Triglycerides: 200 mg/dL — ABNORMAL HIGH (ref 0.0–149.0)
VLDL: 40 mg/dL (ref 0.0–40.0)

## 2024-02-20 MED ORDER — VITAMIN D (ERGOCALCIFEROL) 1.25 MG (50000 UNIT) PO CAPS: ORAL_CAPSULE | ORAL | 0 refills | Status: DC

## 2024-02-21 ENCOUNTER — Ambulatory Visit: Admitting: General Practice

## 2024-02-23 ENCOUNTER — Telehealth: Payer: Self-pay

## 2024-02-23 DIAGNOSIS — L659 Nonscarring hair loss, unspecified: Secondary | ICD-10-CM

## 2024-02-23 NOTE — Telephone Encounter (Signed)
 Spoke with patient about referral and she knows she has appt with [facility] in January but is hoping [facility] can see her sooner. 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 Patient requests referral be sent to [facility] for the hair loss as they will not schedule her an appt without a referral.

## 2024-02-23 NOTE — Addendum Note (Signed)
 Addended by: VINCENTE SHIVERS on: 02/23/2024 04:03 PM   Modules accepted: Orders

## 2024-02-23 NOTE — Telephone Encounter (Signed)
 Copied from CRM [id number]. Topic: Clinical - Medical Advice >> Feb 22, 2024  4:54 PM Shereese L wrote: Reason for CRM: Patient is requesting a referral for a dermatologist  [facility] due to her hair falling and breaking off. She also wants a call back

## 2024-02-23 NOTE — Telephone Encounter (Signed)
 Referral placed for [facility].

## 2024-02-23 NOTE — Telephone Encounter (Unsigned)
 Copied from CRM [id number]. Topic: General - Other >> Feb 23, 2024  3:38 PM Pinkey ORN wrote: Reason for CRM: Returning Missed Office Call >> Feb 23, 2024  3:39 PM Pinkey ORN wrote: Patient is returning a missed office call from Tennie Harlene NOVAK, CMA

## 2024-02-23 NOTE — Telephone Encounter (Signed)
 LMTCB; have questions about referral requested.

## 2024-02-27 ENCOUNTER — Other Ambulatory Visit: Payer: Self-pay

## 2024-02-27 ENCOUNTER — Ambulatory Visit (INDEPENDENT_AMBULATORY_CARE_PROVIDER_SITE_OTHER)

## 2024-02-27 DIAGNOSIS — L650 Telogen effluvium: Secondary | ICD-10-CM | POA: Diagnosis not present

## 2024-02-27 DIAGNOSIS — L814 Other melanin hyperpigmentation: Secondary | ICD-10-CM

## 2024-02-27 DIAGNOSIS — L299 Pruritus, unspecified: Secondary | ICD-10-CM

## 2024-02-27 DIAGNOSIS — L700 Acne vulgaris: Secondary | ICD-10-CM | POA: Diagnosis not present

## 2024-02-27 DIAGNOSIS — L649 Androgenic alopecia, unspecified: Secondary | ICD-10-CM | POA: Diagnosis not present

## 2024-02-27 MED ORDER — CLOBETASOL PROPIONATE 0.05 % EX SOLN: CUTANEOUS | 3 refills | Status: AC

## 2024-02-27 MED ORDER — TRETINOIN 0.025 % EX CREA: TOPICAL_CREAM | Freq: Every day | CUTANEOUS | 5 refills | Status: DC

## 2024-02-27 MED ORDER — RETIN-A 0.025 % EX CREA: TOPICAL_CREAM | CUTANEOUS | 5 refills | Status: AC

## 2024-02-27 NOTE — Patient Instructions (Addendum)
 Begin Minoxidil 5% foam (or solution) otherwise known as Rogaine - this is often called Mens Strength - but we use it exclusively in men and women. You are going to apply this once or twice daily in areas where you feel are thinner than the remainder.  It can take up to 6-8 months of applying this medication to notice an improvement.  You will need to use this long term to maintain the results of increased hair growth.   - Tretinoin  0.025% cream qhs as tolerated - Explained this will cause irritation, dryness, and peeling with initial use, therefore start every 3 nights and increase frequency slowly   Due to recent changes in healthcare laws, you may see results of your pathology and/or laboratory studies on MyChart before the doctors have had a chance to review them. We understand that in some cases there may be results that are confusing or concerning to you. Please understand that not all results are received at the same time and often the doctors may need to interpret multiple results in order to provide you with the best plan of care or course of treatment. Therefore, we ask that you please give us  2 business days to thoroughly review all your results before contacting the office for clarification. Should we see a critical lab result, you will be contacted sooner.   If You Need Anything After Your Visit  If you have any questions or concerns for your doctor, please call our main line at [phone] and press option 4 to reach your doctor's medical assistant. If no one answers, please leave a voicemail as directed and we will return your call as soon as possible. Messages left after 4 pm will be answered the following business day.   You may also send us  a message via MyChart. We typically respond to MyChart messages within 1-2 business days.  For prescription refills, please ask your pharmacy to contact our office. Our fax number is [phone].  If you have an urgent issue when the  clinic is closed that cannot wait until the next business day, you can page your doctor at the number below.    Please note that while we do our best to be available for urgent issues outside of office hours, we are not available 24/7.   If you have an urgent issue and are unable to reach us , you may choose to seek medical care at your doctor's office, retail clinic, urgent care center, or emergency room.  If you have a medical emergency, please immediately call 911 or go to the emergency department.  Pager Numbers  - Dr. Hester: [phone]  - Dr. Jackquline: [phone]  - Dr. Claudene: [phone]   - Dr. Raymund: [phone]  In the event of inclement weather, please call our main line at [phone] for an update on the status of any delays or closures.  Dermatology Medication Tips: Please keep the boxes that topical medications come in in order to help keep track of the instructions about where and how to use these. Pharmacies typically print the medication instructions only on the boxes and not directly on the medication tubes.   If your medication is too expensive, please contact our office at [phone] option 4 or send us  a message through MyChart.   We are unable to tell what your co-pay for medications will be in advance as this is different depending on your insurance coverage. However, we may be able to find a substitute medication at lower cost or  fill out paperwork to get insurance to cover a needed medication.   If a prior authorization is required to get your medication covered by your insurance company, please allow us  1-2 business days to complete this process.  Drug prices often vary depending on where the prescription is filled and some pharmacies may offer cheaper prices.  The website www.goodrx.com contains coupons for medications through different pharmacies. The prices here do not account for what the cost may be with help from insurance (it may be cheaper  with your insurance), but the website can give you the price if you did not use any insurance.  - You can print the associated coupon and take it with your prescription to the pharmacy.  - You may also stop by our office during regular business hours and pick up a GoodRx coupon card.  - If you need your prescription sent electronically to a different pharmacy, notify our office through [facility] or by phone at [phone] option 4.     Si Usted Necesita Algo Despus de Su Visita  Tambin puede enviarnos un mensaje a travs de Clinical cytogeneticist. Por lo general respondemos a los mensajes de MyChart en el transcurso de 1 a 2 das hbiles.  Para renovar recetas, por favor pida a su farmacia que se ponga en contacto con nuestra oficina. Randi lakes de fax es [place] [phone].  Si tiene un asunto urgente cuando la clnica est cerrada y que no puede esperar hasta el siguiente da hbil, puede llamar/localizar a su doctor(a) al nmero que aparece a continuacin.   Por favor, tenga en cuenta que aunque hacemos todo lo posible para estar disponibles para asuntos urgentes fuera del horario de [place], no estamos disponibles las 24 horas del da, los 7 [address]  [address] de la [place].   Si tiene un problema urgente y no puede comunicarse con nosotros, puede optar por buscar atencin mdica  en el consultorio de su doctor(a), en una clnica privada, en un centro de atencin urgente o en una sala de emergencias.  Si tiene Engineer, drilling, por favor llame inmediatamente al 911 o vaya a la sala de emergencias.  Nmeros de bper  - Dr. Hester: [phone]  - Dra. Jackquline: [phone]  - Dr. Claudene: [phone]  - Dra. Kitts: [phone]  En caso de inclemencias del [place], por favor llame a nuestra lnea principal al [phone] para una actualizacin sobre el estado de cualquier retraso o cierre.  Consejos para la medicacin en dermatologa: Por favor, guarde las cajas en las que vienen los  medicamentos de uso tpico para ayudarle a seguir las instrucciones sobre dnde y cmo usarlos. Las farmacias generalmente imprimen las instrucciones del medicamento slo en las cajas y no directamente en los tubos del [place].   Si su medicamento es muy caro, por favor, pngase en contacto con landry rieger llamando al [phone] y presione la opcin 4 o envenos un mensaje a travs de Clinical cytogeneticist.   No podemos decirle cul ser su copago por los medicamentos por adelantado ya que esto es diferente dependiendo de la cobertura de su seguro. Sin embargo, es posible que podamos encontrar un medicamento sustituto a Audiological scientist un formulario para que el seguro cubra el medicamento que se considera necesario.   Si se requiere una autorizacin previa para que su compaa de seguros malta su medicamento, por favor permtanos de 1 a 2 das hbiles para completar este proceso.  Los precios de los medicamentos varan con frecuencia dependiendo del Environmental consultant de  dnde se surte la receta y alguna farmacias pueden ofrecer precios ms baratos.  El sitio web www.goodrx.com tiene cupones para medicamentos de Health and safety inspector. Los precios aqu no tienen en cuenta lo que podra costar con la ayuda del seguro (puede ser ms barato con su seguro), pero el sitio web puede darle el precio si no utiliz Tourist information centre manager.  - Puede imprimir el cupn correspondiente y llevarlo con su receta a la farmacia.  - Tambin puede pasar por nuestra oficina durante el horario de atencin regular y Education officer, museum una tarjeta de cupones de GoodRx.  - Si necesita que su receta se enve electrnicamente a una farmacia diferente, informe a nuestra oficina a travs de MyChart de [place] o por telfono llamando al [phone] y presione la opcin 4.

## 2024-02-27 NOTE — Progress Notes (Signed)
 MCD requires name brand. Retin A 0.025% cream sent to pharmacy.

## 2024-02-27 NOTE — Progress Notes (Signed)
 Subjective   Kristin Espinoza is a 53 y.o. female who presents for the following: itchy burning scalp, thinning hair, and hair breakage. Patient is established patient   Today patient reports: Pt c/o intensely itchy scalp x 3-4 mths which comes and goes, hair loss and hair breakage x 1 mths, pt states that she is menopausal. Pt had Covid about 9 mths ago. No surgeries or new stress. Pt has hx of low vitamin D  and ferritin levels. Pt using OTC collagen, hylaronic acid, biotin supplements, and vitamin D  50,000 units QW.  Review of Systems:    No other skin or systemic complaints except as noted in HPI or Assessment and Plan.  The following portions of the chart were reviewed this encounter and updated as appropriate: medications, allergies, medical history  Relevant Medical History:  Pt states that she has low vitamin D  and ferritin, and she takes supplements for both  Objective  Well appearing patient in no apparent distress; mood and affect are within normal limits. Examination was performed of the: Focused Exam of: the face and scalp   Examination notable for: negative hair pull test. Widening of part.  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 - Treatment is not necessary as telogen effluvium is frequently self-correcting. - Avoid over-vigorous combing, brushing and any type of scalp massage - start clobetasol solution to scalp QD-BID PRN. Topical steroids (such as triamcinolone, fluocinolone, fluocinonide, mometasone , clobetasol, halobetasol, betamethasone, hydrocortisone) can cause thinning and  lightening of the skin if they are used for too long in the same area. Your physician has selected the right strength medicine for your problem and area affected on the body. Please use your medication only as directed by your physician to prevent side effects.  - Recommend OTC Rogaine 5% daily. - Ok to use Nutrafol if she desires.  - Consider Minoxidil 2.5 mg po QD if not improving with topical treatment.  Chronic and persistent condition with duration or expected duration over one year. Condition is symptomatic and bothersome to patient. Patient is flaring and not currently at treatment goal.   Acne vulgaris - mild W/ lentigines, hyperpigmentation of face  - Discussed various treatment options with patient, as well as need for consistent use for at least 6-12 weeks for full efficacy.  - Reviewed treatment options, including side effects of topical agents, oral antibiotics, OCPs (if female), oral spironolactone (if female), and isotretinoin. Discussed that isotretinoin is the most effective  - After discussion opted to initiate:  - Start Tretinoin  0.025% cream QHS. Topical retinoid medications like tretinoin /Retin-A , adapalene /Differin , tazarotene/Fabior, and Epiduo/Epiduo Forte can cause dryness and irritation when first started. Only apply a pea-sized amount to the entire affected area. Avoid applying it around the eyes, edges of mouth and creases at the nose. If you experience irritation, use a good moisturizer first and/or apply the medicine less often. If you are doing well with the medicine, you can increase how often you use it until you are applying every night. Be careful with sun protection while using this medication as it can make you  sensitive to the sun. This medicine should not be used by pregnant women.   Reports intermittent milia/blackheads under breast and in groin - Recommended scheduling separate visit for this to better assess   Procedures, orders, diagnosis for this visit:     There are no diagnoses linked to this encounter.  Return to clinic: Return in 6 months (on 08/27/2024) for hair loss recheck.  LILLETTE Rosina Mayans, CMA, am acting as scribe for Lauraine JAYSON Kanaris, MD .  Documentation: I have reviewed the above documentation for accuracy and completeness, and I agree with the above.  Lauraine JAYSON Kanaris, MD

## 2024-03-14 ENCOUNTER — Ambulatory Visit

## 2024-03-16 ENCOUNTER — Other Ambulatory Visit: Payer: Self-pay | Admitting: General Practice

## 2024-03-16 DIAGNOSIS — F419 Anxiety disorder, unspecified: Secondary | ICD-10-CM

## 2024-03-18 ENCOUNTER — Other Ambulatory Visit: Payer: Self-pay | Admitting: Medical Genetics

## 2024-03-18 ENCOUNTER — Ambulatory Visit: Admitting: General Practice

## 2024-03-18 ENCOUNTER — Ambulatory Visit: Payer: Self-pay | Admitting: General Practice

## 2024-03-18 ENCOUNTER — Encounter: Payer: Self-pay | Admitting: General Practice

## 2024-03-18 VITALS — BP 126/82 | HR 95 | Temp 98.1°F | Ht 65.36 in | Wt 187.0 lb

## 2024-03-18 DIAGNOSIS — R051 Acute cough: Secondary | ICD-10-CM

## 2024-03-18 DIAGNOSIS — F32A Depression, unspecified: Secondary | ICD-10-CM

## 2024-03-18 DIAGNOSIS — J40 Bronchitis, not specified as acute or chronic: Secondary | ICD-10-CM

## 2024-03-18 DIAGNOSIS — Z006 Encounter for examination for normal comparison and control in clinical research program: Secondary | ICD-10-CM

## 2024-03-18 DIAGNOSIS — F419 Anxiety disorder, unspecified: Secondary | ICD-10-CM

## 2024-03-18 LAB — POC COVID19 BINAXNOW: SARS Coronavirus 2 Ag: NEGATIVE

## 2024-03-18 LAB — POCT INFLUENZA A/B
Influenza A, POC: NEGATIVE
Influenza B, POC: NEGATIVE

## 2024-03-18 MED ORDER — PROMETHAZINE-DM 6.25-15 MG/5ML PO SYRP: 5.0000 mL | ORAL_SOLUTION | Freq: Four times a day (QID) | ORAL | 0 refills | Status: DC | PRN

## 2024-03-18 MED ORDER — SERTRALINE HCL 50 MG PO TABS: 50.0000 mg | ORAL_TABLET | Freq: Every day | ORAL | 1 refills | Status: AC

## 2024-03-18 MED ORDER — AMOXICILLIN-POT CLAVULANATE 875-125 MG PO TABS: 1.0000 | ORAL_TABLET | Freq: Two times a day (BID) | ORAL | 0 refills | Status: AC

## 2024-03-18 MED ORDER — ALBUTEROL SULFATE HFA 108 (90 BASE) MCG/ACT IN AERS: 2.0000 | INHALATION_SPRAY | Freq: Four times a day (QID) | RESPIRATORY_TRACT | 1 refills | Status: AC | PRN

## 2024-03-18 NOTE — Patient Instructions (Addendum)
 Start Augmentin  antibiotics. Take 1 tablet by mouth twice daily for 7 days.   Start Albuterol 2 puffs as needed every 6 hours.   Start Promethazine 5 ml for cough. It can make you drowsy.   You can try a few things over the counter to help with your symptoms including:  Cough: Delsym or Robitussin (get the off brand, works just as well) Chest Congestion: Mucinex (plain) Nasal Congestion/Ear Pressure/Sinus Pressure: Try using Flonase (fluticasone) nasal spray. Instill 1 spray in each nostril twice daily. This can be purchased over the counter. Body aches, fevers, headache: Ibuprofen (not to exceed 2400 mg in 24 hours) or Acetaminophen-Tylenol (not to exceed 3000 mg in 24 hours) Runny Nose/Throat Drainage/Sneezing/Itchy or Watery Eyes: An antihistamine such as Zyrtec, Claritin, Xyzal, Allegra  Increase fluid intake, rest and humidifier.   F/u in July for physical and fasting labs.   It was a pleasure to see you today!

## 2024-03-18 NOTE — Progress Notes (Signed)
 Established Patient Office Visit  Subjective   Patient ID: Kristin Espinoza, female    DOB: [birth year]  Age: 53 y.o. MRN: [id number]  Chief Complaint  Patient presents with   Anxiety    And depression f/u   Cough    With congestion, fatigue, aches, diarrhea, sinus pain, headaches; fever a day or so. Patient has been taking tylenol and ibuprofen. Patients sx started about Tuesday night.     Anxiety Symptoms include shortness of breath. Patient reports no chest pain, dizziness, nausea, nervous/anxious behavior or suicidal ideas.    Cough Associated symptoms include ear pain, headaches, shortness of breath and wheezing. Pertinent negatives include no chest pain, chills, fever, heartburn or sore throat.    Kristin Espinoza is a 53 year old female with past medical history of BV, SUI, anemia, obesity, anxiety, depression, prediabetes, HLD, vitamin d  deficiency, tobacco abuse presents today for an acute visit.   Discussed the use of AI scribe software for clinical note transcription with the patient, who gave verbal consent to proceed.  History of Present Illness Kristin Espinoza is a 53 year old female with asthma who presents with symptoms of an upper respiratory infection.  She has been experiencing symptoms of an upper respiratory infection since Tuesday evening, including cough, congestion, fatigue, body aches, diarrhea, sinus pain, headaches, and a fever that lasted for about a day. She describes tightness and congestion in her chest, which she attributes to frequent coughing. Ear pain occurs when she blows her nose, causing a popping sensation followed by pain.  She has a history of asthma and uses albuterol as needed, especially when ill. She reports shortness of breath and wheezing associated with her current illness and is currently using albuterol, mentioning the need for a refill. No dizziness is noted, but she reports headaches. Increased urinary frequency is  attributed to increased fluid intake, including water and orange juice.  She is currently taking Zoloft for anxiety, which has been effective in reducing her anxiety levels. She mentions sleeping better when she does sleep.    Patient Active Problem List   Diagnosis Date Noted   Nocturia 12/18/2023   Urinary frequency 12/18/2023   Vaginal atrophy 12/18/2023   Prediabetes 11/16/2023   Mixed hyperlipidemia 11/16/2023   Peripheral edema 11/16/2023   Establishing care with new doctor, encounter for 11/16/2023   Vitamin D  deficiency 11/16/2023   Tobacco abuse counseling 11/16/2023   Class 1 obesity due to excess calories with serious comorbidity and body mass index (BMI) of 33.0 to 33.9 in adult 03/21/2023   Anxiety and depression 03/21/2023   Absolute anemia 01/09/2023   Vaginal discharge 12/07/2017   Urinary incontinence in female 12/07/2017   SUI (stress urinary incontinence, female) 10/18/2017   Bacterial vaginosis 10/18/2017   Past Medical History:  Diagnosis Date   Anemia    Fainting spell    Heart murmur    History of urinary tract infection    Hyperlipidemia    History reviewed. No pertinent surgical history. No Known Allergies       02/19/2024    4:06 PM 01/03/2024   10:27 AM 11/16/2023   11:15 AM  Depression screen PHQ 2/9  Decreased Interest 0 0 0  Down, Depressed, Hopeless 1 0 0  PHQ - 2 Score 1 0 0  Altered sleeping 0 0 0  Tired, decreased energy 1 1 1   Change in appetite 0 0 0  Feeling bad or failure about yourself  0 0 0  Trouble concentrating 0 0 0  Moving slowly or fidgety/restless 0 0 0  Suicidal thoughts 0 0 0  PHQ-9 Score 2 1 1   Difficult doing work/chores Not difficult at all Not difficult at all Not difficult at all       02/19/2024    4:07 PM 01/03/2024   10:27 AM 11/16/2023   11:16 AM  GAD 7 : Generalized Anxiety Score  Nervous, Anxious, on Edge 0 0 0  Control/stop worrying 0 0 0  Worry too much - different things 1 0 0  Trouble relaxing 0 0  0  Restless 0 0 0  Easily annoyed or irritable 0 0 1  Afraid - awful might happen 0 0 0  Total GAD 7 Score 1 0 1  Anxiety Difficulty Not difficult at all Not difficult at all Not difficult at all      Review of Systems  Constitutional:  Negative for chills and fever.  HENT:  Positive for ear pain and sinus pain. Negative for sore throat.   Respiratory:  Positive for cough, shortness of breath and wheezing.   Cardiovascular:  Negative for chest pain.  Gastrointestinal:  Positive for diarrhea. Negative for abdominal pain, constipation, heartburn, nausea and vomiting.  Genitourinary:  Negative for dysuria, frequency and urgency.  Neurological:  Positive for headaches. Negative for dizziness.  Endo/Heme/Allergies:  Negative for polydipsia.  Psychiatric/Behavioral:  Negative for depression and suicidal ideas. The patient is not nervous/anxious.       Objective:     BP 126/82   Pulse 95   Temp 98.1 F (36.7 C) (Temporal)   Ht 5' 5.36 (1.66 m)   Wt 187 lb (84.8 kg)   SpO2 99%   BMI 30.78 kg/m  BP Readings from Last 3 Encounters:  03/18/24 126/82  02/19/24 124/80  01/03/24 106/80   Wt Readings from Last 3 Encounters:  03/18/24 187 lb (84.8 kg)  02/19/24 189 lb (85.7 kg)  01/03/24 189 lb (85.7 kg)      Physical Exam Vitals and nursing note reviewed.  Constitutional:      Appearance: Normal appearance.  HENT:     Right Ear: Tympanic membrane, ear canal and external ear normal.     Left Ear: Tympanic membrane, ear canal and external ear normal.     Nose:     Right Sinus: Frontal sinus tenderness present. No maxillary sinus tenderness.     Left Sinus: Frontal sinus tenderness present. No maxillary sinus tenderness.     Mouth/Throat:     Pharynx: Postnasal drip present.  Cardiovascular:     Rate and Rhythm: Normal rate and regular rhythm.     Pulses: Normal pulses.     Heart sounds: Normal heart sounds.  Pulmonary:     Effort: Pulmonary effort is normal.     Breath  sounds: Normal breath sounds.  Neurological:     Mental Status: She is alert and oriented to person, place, and time.  Psychiatric:        Mood and Affect: Mood normal.        Behavior: Behavior normal.        Thought Content: Thought content normal.        Judgment: Judgment normal.      Results for orders placed or performed in visit on 03/18/24  POC COVID-19  Result Value Ref Range   SARS Coronavirus 2 Ag Negative Negative  POCT Influenza A/B  Result Value Ref Range   Influenza A, POC Negative Negative  Influenza B, POC Negative Negative       The 10-year ASCVD risk score (Arnett DK, et al., 2019) is: 6.3%    Assessment & Plan:  Anxiety and depression -     Sertraline HCl; Take 1 tablet (50 mg total) by mouth daily.  Dispense: 90 tablet; Refill: 1  Acute cough -     POC COVID-19 BinaxNow -     POCT Influenza A/B -     Promethazine-DM; Take 5 mLs by mouth 4 (four) times daily as needed.  Dispense: 118 mL; Refill: 0  Bronchitis -     Albuterol Sulfate HFA; Inhale 2 puffs into the lungs every 6 (six) hours as needed for shortness of breath or wheezing.  Dispense: 6.7 g; Refill: 1 -     Amoxicillin -Pot Clavulanate; Take 1 tablet by mouth 2 (two) times daily for 7 days.  Dispense: 14 tablet; Refill: 0    Assessment and Plan Assessment & Plan Acute bronchitis Symptoms and negative COVID-19 and influenza tests suggest acute bronchitis with possible sinus infection. - no red flags on exam. Covid and flu negative in office today. - given presentation and symptoms, will treat with Augmentin .  - Recommend guaifenesin for mucus expectoration. - Instruct to use fluticasone nasal spray for sinus pain. - Prescribe albuterol inhaler, 2 puffs as needed every 6 hours. - Encourage increased fluid intake and rest and humidifier at night.  - Start promethazine 5 mL for cough, cautioning drowsiness.  Anxiety Improvement in anxiety symptoms and sleep with sertraline. Follow-up in  30 days to evaluate dosage adjustment. - Continue sertraline 50 mg daily. Refill sent.   Return in about 8 months (around 11/15/2024) for physical and fasting labs. SABRA Carrol Aurora, NP

## 2024-03-19 ENCOUNTER — Ambulatory Visit: Admitting: Obstetrics

## 2024-03-20 ENCOUNTER — Telehealth: Payer: Self-pay | Admitting: General Practice

## 2024-03-20 NOTE — Telephone Encounter (Signed)
 Copied from CRM [id number]. Topic: General - Other >> Mar 20, 2024 12:54 PM Aleatha C wrote: Reason for CRM: Patient is still not feeling well since Monday app and needs a extended work note, she has a high fever and if it can be uploaded to [employer] or sent to email [email or web address]

## 2024-03-20 NOTE — Telephone Encounter (Signed)
Okay to extend work note.

## 2024-03-21 ENCOUNTER — Telehealth: Payer: Self-pay | Admitting: General Practice

## 2024-03-21 NOTE — Telephone Encounter (Signed)
 Work note done and sent to patients mychart and patient is aware.

## 2024-03-21 NOTE — Telephone Encounter (Signed)
 Duplicate noe; currently awaiting [employer] response in another TE.

## 2024-03-21 NOTE — Telephone Encounter (Signed)
 Copied from CRM [id number]. Topic: General - Other >> Mar 20, 2024 12:54 PM Aleatha C wrote: Reason for CRM: Patient is still not feeling well since Monday app and needs a extended work note, she has a high fever and if it can be uploaded to [employer] or sent to email [email or web address]  Yes okay extend work note

## 2024-04-03 ENCOUNTER — Ambulatory Visit

## 2024-04-17 ENCOUNTER — Ambulatory Visit

## 2024-04-29 ENCOUNTER — Ambulatory Visit

## 2024-05-21 ENCOUNTER — Other Ambulatory Visit: Payer: Self-pay | Admitting: General Practice

## 2024-05-21 DIAGNOSIS — E559 Vitamin D deficiency, unspecified: Secondary | ICD-10-CM

## 2024-05-22 NOTE — Telephone Encounter (Signed)
 Refill request received for Vitamin D  50,000 FOV:11/18/2024 LOV:03/18/2024 Last refill:02/20/2024 Medication is pending your approval. Pt has not yet had her vitamin d  level rechecked.

## 2024-05-27 ENCOUNTER — Ambulatory Visit: Payer: Self-pay | Admitting: Physician Assistant

## 2024-06-20 ENCOUNTER — Telehealth: Payer: Self-pay

## 2024-06-20 DIAGNOSIS — U071 COVID-19: Secondary | ICD-10-CM

## 2024-06-20 MED ORDER — GUAIFENESIN-DM 100-10 MG/5ML PO SYRP: 5.0000 mL | ORAL_SOLUTION | ORAL | 0 refills | Status: AC | PRN

## 2024-06-20 NOTE — Patient Instructions (Signed)
 " Kristin Espinoza, thank you for joining Lamar Schlossman, PA-C for today's virtual visit.  While this provider is not your primary care provider (PCP), if your PCP is located in our provider database this encounter information will be shared with them immediately following your visit.   A [facility] MyChart account gives you access to today's visit and all your visits, tests, and labs performed at [facility]  click here if you don't have a [facility] MyChart account or go to [email or web address]  Consent: (Patient) Kristin Espinoza provided verbal consent for this virtual visit at the beginning of the encounter.  Current Medications:  Current Outpatient Medications:    guaiFENesin -dextromethorphan (ROBITUSSIN DM) 100-10 MG/5ML syrup, Take 5 mLs by mouth every 4 (four) hours as needed for cough., Disp: 118 mL, Rfl: 0   albuterol  (VENTOLIN  HFA) 108 (90 Base) MCG/ACT inhaler, Inhale 2 puffs into the lungs every 6 (six) hours as needed for shortness of breath or wheezing., Disp: 6.7 g, Rfl: 1   buPROPion  (WELLBUTRIN  XL) 150 MG 24 hr tablet, TAKE 1 TABLET(150 MG) BY MOUTH EVERY MORNING, Disp: 30 tablet, Rfl: 2   clobetasol  (TEMOVATE ) 0.05 % external solution, Apply to itchy areas of scalp once to twice daily as needed. Avoid applying to face, groin, and axilla. Use as directed. Long-term use can cause thinning of the skin., Disp: 50 mL, Rfl: 3   estradiol  (ESTRACE ) 0.1 MG/GM vaginal cream, Place 0.5-1g twice a week after, Disp: 42.5 g, Rfl: 3   pantoprazole  (PROTONIX ) 40 MG tablet, Take 1 tablet (40 mg total) by mouth daily., Disp: 30 tablet, Rfl: 2   RETIN-A  0.025 % cream, Apply 2-3 times weekly at night to dry skin after cleaning. Increase frequency up to nightly as tolerated., Disp: 20 g, Rfl: 5   sertraline  (ZOLOFT ) 50 MG tablet, Take 1 tablet (50 mg total) by mouth daily., Disp: 90 tablet, Rfl: 1   Vibegron  (GEMTESA ) 75 MG TABS, Take 1 tablet (75 mg total) by mouth  daily. (Patient not taking: Reported on 02/27/2024), Disp: 30 tablet, Rfl: 2   Vitamin D , Ergocalciferol , (DRISDOL ) 1.25 MG (50000 UNIT) CAPS capsule, TAKE 1 CAPSULE BY MOUTH 1 TIME WEEKLY FOR 12 WEEKS, Disp: 12 capsule, Rfl: 0   Medications ordered in this encounter:  Meds ordered this encounter  Medications   guaiFENesin -dextromethorphan (ROBITUSSIN DM) 100-10 MG/5ML syrup    Sig: Take 5 mLs by mouth every 4 (four) hours as needed for cough.    Dispense:  118 mL    Refill:  0    Supervising Provider:   BLAISE ALEENE KIDD [[id number]]     *If you need refills on other medications prior to your next appointment, please contact your pharmacy*  Follow-Up: Call back or seek an in-person evaluation if the symptoms worsen or if the condition fails to improve as anticipated.     Other Instructions    If you have been instructed to have an in-person evaluation today at a local Urgent Care facility, please use the link below. It will take you to a list of all of our available [facility], including address, phone number and hours of operation. Please do not delay care.  [facility]  If you or a family member do not have a primary care provider, use the link below to schedule a visit and establish care. When you choose a [facility] primary care physician or advanced practice provider, you gain a long-term partner in health.  Find a Primary Care Provider  Learn more about [facility]'s in-office and virtual care options: [facility] - Get Care Now  "

## 2024-06-20 NOTE — Progress Notes (Signed)
 " Virtual Visit Consent   Kristin Espinoza, you are scheduled for a virtual visit with a  provider today. Just as with appointments in the office, your consent must be obtained to participate. Your consent will be active for this visit and any virtual visit you may have with one of our providers in the next 365 days. If you have a MyChart account, a copy of this consent can be sent to you electronically.  As this is a virtual visit, video technology does not allow for your provider to perform a traditional examination. This may limit your provider's ability to fully assess your condition. If your provider identifies any concerns that need to be evaluated in person or the need to arrange testing (such as labs, EKG, etc.), we will make arrangements to do so. Although advances in technology are sophisticated, we cannot ensure that it will always work on either your end or our end. If the connection with a video visit is poor, the visit may have to be switched to a telephone visit. With either a video or telephone visit, we are not always able to ensure that we have a secure connection.  By engaging in this virtual visit, you consent to the provision of healthcare and authorize for your insurance to be billed (if applicable) for the services provided during this visit. Depending on your insurance coverage, you may receive a charge related to this service.  I need to obtain your verbal consent now. Are you willing to proceed with your visit today? Kristin Espinoza has provided verbal consent on 06/20/2024 for a virtual visit (video or telephone). Lamar Schlossman, PA-C  Date: 06/20/2024 9:12 AM   Virtual Visit via Video Note   I, Lamar Schlossman, connected with  Kristin Espinoza  ([id number], 25-Jun-1970) on 06/20/24 at  9:00 AM EST by a video-enabled telemedicine application and verified that I am speaking with the correct person using two identifiers.  Location: Patient: Virtual Visit  Location Patient: Home Provider: Virtual Visit Location Provider: Home Office   I discussed the limitations of evaluation and management by telemedicine and the availability of in person appointments. The patient expressed understanding and agreed to proceed.    History of Present Illness: Kristin Espinoza is a 54 y.o. who identifies as a female who was assigned female at birth, and is being seen today for COVID 66.  States that she tested positive for COVID last night.  Has been around many co-workers with the same symptoms.  Complains of body aches, fatigue, chills.  States that she has a little SOB, but it improves with inhaler.  She denies fever.  States that she has been checking her temps.  Was 99.8 last night.    HPI: HPI  Problems:  Patient Active Problem List   Diagnosis Date Noted   Nocturia 12/18/2023   Urinary frequency 12/18/2023   Vaginal atrophy 12/18/2023   Prediabetes 11/16/2023   Mixed hyperlipidemia 11/16/2023   Peripheral edema 11/16/2023   Establishing care with new doctor, encounter for 11/16/2023   Vitamin D  deficiency 11/16/2023   Tobacco abuse counseling 11/16/2023   Class 1 obesity due to excess calories with serious comorbidity and body mass index (BMI) of 33.0 to 33.9 in adult 03/21/2023   Anxiety and depression 03/21/2023   Absolute anemia 01/09/2023   Vaginal discharge 12/07/2017   Urinary incontinence in female 12/07/2017   SUI (stress urinary incontinence, female) 10/18/2017   Bacterial vaginosis 10/18/2017    Allergies: Allergies[1]  Medications: Current Medications[2]  Observations/Objective: Patient is well-developed, well-nourished in no acute distress.  Resting comfortably at home.  Head is normocephalic, atraumatic.  No labored breathing.  Speech is clear and coherent with logical content.  Patient is alert and oriented at baseline.    Assessment and Plan: 1. COVID-19 (Primary)   Meds ordered this encounter  Medications    guaiFENesin -dextromethorphan (ROBITUSSIN DM) 100-10 MG/5ML syrup    Sig: Take 5 mLs by mouth every 4 (four) hours as needed for cough.    Dispense:  118 mL    Refill:  0    Supervising Provider:   BLAISE ALEENE KIDD [id number]   Patient presenting with bodyaches, cough, and fatigue.  Tested postiive for COVID last night.  We discussed treatment options, including antiviral therapy, but patient declines this because it made her feel worse last time she took it.    Will RX cough medicine as above.  Supportive care discussed.  Return/follow-up instructions given.  Follow Up Instructions: I discussed the assessment and treatment plan with the patient. The patient was provided an opportunity to ask questions and all were answered. The patient agreed with the plan and demonstrated an understanding of the instructions.  A copy of instructions were sent to the patient via MyChart unless otherwise noted below.     The patient was advised to call back or seek an in-person evaluation if the symptoms worsen or if the condition fails to improve as anticipated.    Lamar Schlossman, PA-C     [1] No Known Allergies [2]  Current Outpatient Medications:    guaiFENesin -dextromethorphan (ROBITUSSIN DM) 100-10 MG/5ML syrup, Take 5 mLs by mouth every 4 (four) hours as needed for cough., Disp: 118 mL, Rfl: 0   albuterol  (VENTOLIN  HFA) 108 (90 Base) MCG/ACT inhaler, Inhale 2 puffs into the lungs every 6 (six) hours as needed for shortness of breath or wheezing., Disp: 6.7 g, Rfl: 1   buPROPion  (WELLBUTRIN  XL) 150 MG 24 hr tablet, TAKE 1 TABLET(150 MG) BY MOUTH EVERY MORNING, Disp: 30 tablet, Rfl: 2   clobetasol  (TEMOVATE ) 0.05 % external solution, Apply to itchy areas of scalp once to twice daily as needed. Avoid applying to face, groin, and axilla. Use as directed. Long-term use can cause thinning of the skin., Disp: 50 mL, Rfl: 3   estradiol  (ESTRACE ) 0.1 MG/GM vaginal cream, Place 0.5-1g twice a week after,  Disp: 42.5 g, Rfl: 3   pantoprazole  (PROTONIX ) 40 MG tablet, Take 1 tablet (40 mg total) by mouth daily., Disp: 30 tablet, Rfl: 2   RETIN-A  0.025 % cream, Apply 2-3 times weekly at night to dry skin after cleaning. Increase frequency up to nightly as tolerated., Disp: 20 g, Rfl: 5   sertraline  (ZOLOFT ) 50 MG tablet, Take 1 tablet (50 mg total) by mouth daily., Disp: 90 tablet, Rfl: 1   Vibegron  (GEMTESA ) 75 MG TABS, Take 1 tablet (75 mg total) by mouth daily. (Patient not taking: Reported on 02/27/2024), Disp: 30 tablet, Rfl: 2   Vitamin D , Ergocalciferol , (DRISDOL ) 1.25 MG (50000 UNIT) CAPS capsule, TAKE 1 CAPSULE BY MOUTH 1 TIME WEEKLY FOR 12 WEEKS, Disp: 12 capsule, Rfl: 0  "

## 2024-08-28 ENCOUNTER — Ambulatory Visit

## 2024-11-18 ENCOUNTER — Encounter: Admitting: General Practice
